# Patient Record
Sex: Female | Born: 1975 | Hispanic: Yes | Smoking: Never smoker
Health system: Southern US, Community
[De-identification: ages and names within clinical notes are randomized; demographics above are authoritative.]

## PROBLEM LIST (undated history)

## (undated) DIAGNOSIS — D649 Anemia, unspecified: Secondary | ICD-10-CM

## (undated) DIAGNOSIS — O009 Unspecified ectopic pregnancy without intrauterine pregnancy: Secondary | ICD-10-CM

## (undated) HISTORY — PX: BREAST BIOPSY: SHX20

---

## 2013-03-08 HISTORY — PX: LAPAROSCOPIC UNILATERAL SALPINGECTOMY: SHX5934

## 2014-10-08 ENCOUNTER — Inpatient Hospital Stay (HOSPITAL_COMMUNITY)
Admission: AD | Admit: 2014-10-08 | Discharge: 2014-10-08 | Disposition: A | Discharge status: Home / Self Care | Payer: Self-pay | Source: Ambulatory Visit | Attending: Family Medicine | Admitting: Family Medicine

## 2014-10-08 ENCOUNTER — Encounter (HOSPITAL_COMMUNITY): Payer: Self-pay | Admitting: *Deleted

## 2014-10-08 DIAGNOSIS — N912 Amenorrhea, unspecified: Secondary | ICD-10-CM

## 2014-10-08 DIAGNOSIS — R109 Unspecified abdominal pain: Secondary | ICD-10-CM

## 2014-10-08 LAB — CBC WITH DIFFERENTIAL/PLATELET
BASOS PCT: 1 % (ref 0–1)
Basophils Absolute: 0.1 10*3/uL (ref 0.0–0.1)
EOS ABS: 0.2 10*3/uL (ref 0.0–0.7)
Eosinophils Relative: 3 % (ref 0–5)
HCT: 40.7 % (ref 36.0–46.0)
Hemoglobin: 13.7 g/dL (ref 12.0–15.0)
Lymphocytes Relative: 33 % (ref 12–46)
Lymphs Abs: 2.2 10*3/uL (ref 0.7–4.0)
MCH: 31.9 pg (ref 26.0–34.0)
MCHC: 33.7 g/dL (ref 30.0–36.0)
MCV: 94.9 fL (ref 78.0–100.0)
MONOS PCT: 9 % (ref 3–12)
Monocytes Absolute: 0.6 10*3/uL (ref 0.1–1.0)
NEUTROS ABS: 3.8 10*3/uL (ref 1.7–7.7)
NEUTROS PCT: 54 % (ref 43–77)
PLATELETS: 306 10*3/uL (ref 150–400)
RBC: 4.29 MIL/uL (ref 3.87–5.11)
RDW: 14.9 % (ref 11.5–15.5)
WBC: 6.9 10*3/uL (ref 4.0–10.5)

## 2014-10-08 LAB — HCG, QUANTITATIVE, PREGNANCY

## 2014-10-08 LAB — WET PREP, GENITAL
Clue Cells Wet Prep HPF POC: NONE SEEN
TRICH WET PREP: NONE SEEN
Yeast Wet Prep HPF POC: NONE SEEN

## 2014-10-08 LAB — POCT PREGNANCY, URINE: Preg Test, Ur: NEGATIVE

## 2014-10-08 NOTE — MAU Note (Signed)
Pain in lower abd, started yesterday. Comes and goes.  No bleeding or discharge

## 2014-10-08 NOTE — Discharge Instructions (Signed)
All your blood and urine today are normal. Your pregnancy test is negative. Follow up with the health department for a pap smear and birth control. Take tylenol as needed for pain.   Dolor abdominal en las mujeres (Abdominal Pain, Women) El dolor abdominal (en el estmago, la pelvis o el vientre) puede tener muchas causas. Es importante que le informe a su mdico:  La ubicacin del Social research officer, government.  Viene y se va, o persiste todo el tiempo?  Hay situaciones que English as a second language teacher (comer ciertos alimentos, la actividad fsica)?  Tiene otros sntomas asociados al dolor (fiebre, nuseas, vmitos, diarrea)? Todo es de gran ayuda cuando se trata de hallar la causa del dolor. CAUSAS  Estmago: Infecciones por virus o bacterias, o lcera.  Intestino: Apendicitis (apndice inflamado), ileitis regional (enfermedad de Crohn), colitis ulcerosa (colon inflamado), sndrome del colon irritable, diverticulitis (inflamacin de los divertculos del colon) o cncer de estmago oo intestino.  Enfermedades de la vescula biliar o clculos.  Enfermedades renales, clculos o infecciones en el rin.  Infeccin o cncer del pncreas.  Fibromialgia (trastorno doloroso)  Enfermedades de los rganos femeninos:  Uterus: tero: fibroma (tumor no canceroso) o infeccin  Trompas de [place]: infeccin o embarazo ectpico  En los ovarios, quistes o tumores.  Adherencias plvicas (tejido cicatrizal).  Endometriosis (el tejido que cubre el tero se desarrolla en la pelvis y los rganos plvicos).  Sndrome de Occupational psychologist (los rganos femeninos se llenan de sangre antes del periodo menstrual(  Dolor durante el periodo menstrual.  Dolor durante la ovulacin (al producir vulos).  Dolor al usar el DIU (dispositivo intrauterino para el control de la natalidad)  Programmer, systems los rganos femeninos.  Dolor funcional (no est originado en una enfermedad, puede mejorar sin tratamiento).  Dolor de origen  psicolgico  Depresin. DIAGNSTICO Su mdico decidir la gravedad del dolor a travs del examen fsico  Anlisis de sangre  Radiografas  Ecografas  TC (tomografa computada, tipo especial de radiografas).  IMR (resonancia magntica)  Cultivos, en el caso una infeccin  Colon por enema de bario (se inserta una sustancia de contraste en el intestino grueso para mejorar la observacin con rayos X.)  Colonoscopa (observacin del intestino con un tubo luminoso).  Laparoscopa (examen del interior del abdomen con un tubo que tiene [employer]).  Ciruga exploratoria abdominal mayor (se observa el abdomen realizando una gran incisin). TRATAMIENTO El tratamiento depender de la causa del problema.   Muchos de estos casos pueden controlarse y tratarse en casa.  Medicamentos de venta libre indicados por el mdico.  Medicamentos con receta.  Antibiticos, en caso de infeccin  Pldoras anticonceptivas, en el caso de perodos dolorosos o dolor al ovular.  Tratamiento hormonal, para la endometriosis  Inyecciones para bloqueo nervioso selectivo.  Fisioterapia.  Antidepresivos.  Consejos por parte de un psclogo o psiquiatra.  Ciruga mayor o menor. INSTRUCCIONES PARA EL CUIDADO DOMICILIARIO  No tome ni administre laxantes a menos que se lo haya indicado su mdico.  Tome analgsicos de venta libre slo si se lo ha indicado el profesional que lo asiste. No tome aspirina, ya que puede causar [employer] o hemorragias.  Consuma una dieta lquida (caldo o agua) segn lo indicado por el mdico. Progrese lentamente a una dieta blanda, segn la tolerancia, si el dolor se relaciona con el estmago o el intestino.  Tenga un termmetro y tmese la temperatura varias veces al da.  Haga reposo en la cama y [place], si esto Ship broker.  Evite las relaciones sexuales, Higher education careers adviser.  Evite las situaciones estresantes.  Cumpla con las visitas y los  anlisis de control, segn las indicaciones de su mdico.  Si el dolor no se [place] con los medicamentos o la [place], [place] tratar con:  Acupuntura.  Ejercicios de relajacin (yoga, meditacin).  Terapia grupal.  Psicoterapia. SOLICITE ATENCIN MDICA SI:  Nota que ciertos Writer de [place].  El tratamiento indicado para Lexicographer no Engineer, civil (consulting).  Necesita analgsicos ms fuertes.  Quiere que le retiren el DIU.  Si se siente confundido o desfalleciente.  Presenta nuseas o vmitos.  Aparece una erupcin cutnea.  Sufre efectos adversos o una reaccin alrgica debido a los medicamentos que toma. SOLICITE ATENCIN MDICA DE INMEDIATO SI:  El dolor persiste o se agrava.  Tiene fiebre.  Siente el dolor slo en algunos sectores del abdomen. Si se localiza en la zona derecha, posiblemente podra tratarse de apendicitis. En un adulto, si se localiza en la regin inferior izquierda del abdomen, podra tratarse de colitis o diverticulitis.  Hay sangre en las heces (deposiciones de color rojo brillante o negro alquitranado), con o sin vmitos.  Usted presenta sangre en la orina.  Siente escalofros con o sin fiebre.  Se desmaya. ASEGRESE QUE:   Comprende estas instrucciones.  Controlar su enfermedad.  Solicitar ayuda de inmediato si no mejora o si empeora. Document Released: 12/02/2008 Document Revised: 11/08/2011 [facility] Patient Information 2015 [place]. This information is not intended to replace advice given to you by your health care provider. Make sure you discuss any questions you have with your health care provider.

## 2014-10-08 NOTE — MAU Note (Signed)
Interpreter paged several times at [phone] with no response. Now overhead paged

## 2014-10-08 NOTE — MAU Provider Note (Signed)
CSN: [id number]     Arrival date & time 10/08/14  1133 History   None    Chief Complaint  Patient presents with  . Abdominal Pain     (Consider location/radiation/quality/duration/timing/severity/associated sxs/prior Treatment) Patient is a 39 y.o. female presenting with abdominal pain. The history is provided by the patient.  Abdominal Pain The primary symptoms of the illness include abdominal pain. The current episode started yesterday. The onset of the illness was gradual.  Additional symptoms associated with the illness include frequency. Symptoms associated with the illness do not include chills, anorexia, constipation, hematuria or back pain.   Maureen Livingston is a 39 y.o. [id number], Patient's last menstrual period was 08/19/2014. She presents to the ED for a pregnancy test and complaint of abdominal pain. She describes the pain as a squeezing pain in her lower abdomen. She had one episode yesterday that went away and then the same pain returned today. Hx of one EAB, two SAB and one ectopic pregnancy that required surgery. Last pap smear less than one year ago in [place] and was normal. No hx of STI, Sexually active with one partner x 1 year. No birth control.   Past Medical History  Diagnosis Date  . Medical history non-contributory    Past Surgical History  Procedure Laterality Date  . Breast biopsy    . Laparoscopic unilateral salpingectomy  03/08/13    Right side due to ectopic   History reviewed. No pertinent family history. History  Substance Use Topics  . Smoking status: Never Smoker   . Smokeless tobacco: Not on file  . Alcohol Use: Yes     Comment: occasional    OB History    Gravida Para Term Preterm AB TAB SAB Ectopic Multiple Living   4    4  2 1   0     Review of Systems  Constitutional: Negative for chills.  Gastrointestinal: Positive for abdominal pain. Negative for constipation and anorexia.  Genitourinary: Positive for frequency. Negative for hematuria.   Musculoskeletal: Negative for back pain.  all other systems negative    Allergies  Aspirin  Home Medications   Prior to Admission medications   Not on File   BP 92/62 mmHg  Pulse 62  Temp(Src) 97.2 F (36.2 C) (Oral)  Resp 18  Ht 5\' 2"  (1.575 m)  Wt 214 lb (97.07 kg)  BMI 39.13 kg/m2  SpO2 100%  LMP 08/19/2014 Physical Exam  Constitutional: She is oriented to person, place, and time. She appears well-developed and well-nourished.  HENT:  Head: Normocephalic.  Eyes: Conjunctivae and EOM are normal.  Neck: Neck supple.  Cardiovascular: Normal rate.   Pulmonary/Chest: Effort normal.  Abdominal: Soft.  Unable to reproduce the cramping pain the patient has had off and on.   Genitourinary:  External genitalia without lesions. White d/c vaginal vault, no CMT, no adnexal tenderness, uterus without palpable enlargement.   Musculoskeletal: Normal range of motion.  Neurological: She is alert and oriented to person, place, and time. No cranial nerve deficit.  Skin: Skin is warm and dry.  Psychiatric: She has a normal mood and affect. Her behavior is normal.  Nursing note and vitals reviewed.   ED Course  Procedures (including critical care time)   MDM  39 y.o. female with amenorrhea and abdominal cramping x 2 days. Stable for d/c with normal CBC, Bhcg <1 and soft, non surgical abdomen. She will take Advil as needed for pain. She will follow up with the [facility] or the  Health Department. Discussed causes of amenorrhea and plan of care. Patient voices understanding and agrees with plan.  Final diagnoses:  Amenorrhea  Abdominal cramping

## 2014-10-08 NOTE — Progress Notes (Signed)
Written and verbal d/c instructions given and understanding voiced. In-house spanish interpreter helped with d/c instructions

## 2014-10-09 LAB — HIV ANTIBODY (ROUTINE TESTING W REFLEX): HIV SCREEN 4TH GENERATION: NONREACTIVE

## 2014-10-09 LAB — GC/CHLAMYDIA PROBE AMP (~~LOC~~) NOT AT ARMC
Chlamydia: NEGATIVE
Neisseria Gonorrhea: NEGATIVE

## 2014-10-09 LAB — RPR: RPR Ser Ql: NONREACTIVE

## 2014-11-12 ENCOUNTER — Emergency Department (HOSPITAL_COMMUNITY): Payer: Self-pay

## 2014-11-12 ENCOUNTER — Emergency Department (HOSPITAL_COMMUNITY)
Admission: EM | Admit: 2014-11-12 | Discharge: 2014-11-12 | Disposition: A | Discharge status: Home / Self Care | Payer: Self-pay | Attending: Emergency Medicine | Admitting: Emergency Medicine

## 2014-11-12 ENCOUNTER — Encounter (HOSPITAL_COMMUNITY): Payer: Self-pay

## 2014-11-12 DIAGNOSIS — D259 Leiomyoma of uterus, unspecified: Secondary | ICD-10-CM | POA: Insufficient documentation

## 2014-11-12 DIAGNOSIS — R1031 Right lower quadrant pain: Secondary | ICD-10-CM

## 2014-11-12 DIAGNOSIS — Z3202 Encounter for pregnancy test, result negative: Secondary | ICD-10-CM | POA: Insufficient documentation

## 2014-11-12 LAB — URINALYSIS, ROUTINE W REFLEX MICROSCOPIC
Bilirubin Urine: NEGATIVE
GLUCOSE, UA: NEGATIVE mg/dL
Ketones, ur: NEGATIVE mg/dL
Nitrite: NEGATIVE
PROTEIN: NEGATIVE mg/dL
SPECIFIC GRAVITY, URINE: 1.02 (ref 1.005–1.030)
Urobilinogen, UA: 0.2 mg/dL (ref 0.0–1.0)
pH: 6.5 (ref 5.0–8.0)

## 2014-11-12 LAB — COMPREHENSIVE METABOLIC PANEL
ALBUMIN: 3.9 g/dL (ref 3.5–5.2)
ALT: 19 U/L (ref 0–35)
AST: 19 U/L (ref 0–37)
Alkaline Phosphatase: 84 U/L (ref 39–117)
Anion gap: 6 (ref 5–15)
BILIRUBIN TOTAL: 0.4 mg/dL (ref 0.3–1.2)
BUN: 14 mg/dL (ref 6–23)
CO2: 26 mmol/L (ref 19–32)
CREATININE: 0.7 mg/dL (ref 0.50–1.10)
Calcium: 9.2 mg/dL (ref 8.4–10.5)
Chloride: 107 mmol/L (ref 96–112)
GFR calc Af Amer: >90 mL/min (ref >90)
GFR calc non Af Amer: >90 mL/min (ref >90)
Glucose, Bld: 94 mg/dL (ref 70–99)
Potassium: 4 mmol/L (ref 3.5–5.1)
Sodium: 139 mmol/L (ref 135–145)
TOTAL PROTEIN: 7.7 g/dL (ref 6.0–8.3)

## 2014-11-12 LAB — CBC WITH DIFFERENTIAL/PLATELET
Basophils Absolute: 0 10*3/uL (ref 0.0–0.1)
Basophils Relative: 0 % (ref 0–1)
EOS PCT: 2 % (ref 0–5)
Eosinophils Absolute: 0.1 10*3/uL (ref 0.0–0.7)
HEMATOCRIT: 39.9 % (ref 36.0–46.0)
Hemoglobin: 13.3 g/dL (ref 12.0–15.0)
Lymphocytes Relative: 28 % (ref 12–46)
Lymphs Abs: 2 10*3/uL (ref 0.7–4.0)
MCH: 31.3 pg (ref 26.0–34.0)
MCHC: 33.3 g/dL (ref 30.0–36.0)
MCV: 93.9 fL (ref 78.0–100.0)
MONO ABS: 0.6 10*3/uL (ref 0.1–1.0)
MONOS PCT: 8 % (ref 3–12)
NEUTROS ABS: 4.6 10*3/uL (ref 1.7–7.7)
Neutrophils Relative %: 62 % (ref 43–77)
Platelets: 338 10*3/uL (ref 150–400)
RBC: 4.25 MIL/uL (ref 3.87–5.11)
RDW: 14.3 % (ref 11.5–15.5)
WBC: 7.4 10*3/uL (ref 4.0–10.5)

## 2014-11-12 LAB — URINE MICROSCOPIC-ADD ON

## 2014-11-12 LAB — LIPASE, BLOOD: LIPASE: 54 U/L (ref 11–59)

## 2014-11-12 LAB — POC URINE PREG, ED: PREG TEST UR: NEGATIVE

## 2014-11-12 MED ORDER — ONDANSETRON HCL 4 MG/2ML IJ SOLN
4.0000 mg | Freq: Once | INTRAMUSCULAR | Status: AC
  Administered 2014-11-12: 4 mg via INTRAVENOUS
  Filled 2014-11-12: qty 2

## 2014-11-12 MED ORDER — MORPHINE SULFATE 4 MG/ML IJ SOLN
4.0000 mg | Freq: Once | INTRAMUSCULAR | Status: AC
  Administered 2014-11-12: 4 mg via INTRAVENOUS
  Filled 2014-11-12: qty 1

## 2014-11-12 MED ORDER — HYDROCODONE-ACETAMINOPHEN 5-325 MG PO TABS: 1.0000 | ORAL_TABLET | Freq: Four times a day (QID) | ORAL | Status: DC | PRN

## 2014-11-12 MED ORDER — IOHEXOL 300 MG/ML  SOLN
100.0000 mL | Freq: Once | INTRAMUSCULAR | Status: AC | PRN
  Administered 2014-11-12: 100 mL via INTRAVENOUS

## 2014-11-12 MED ORDER — IOHEXOL 300 MG/ML  SOLN
50.0000 mL | Freq: Once | INTRAMUSCULAR | Status: AC | PRN
  Administered 2014-11-12: 50 mL via ORAL

## 2014-11-12 NOTE — Progress Notes (Signed)
  CARE MANAGEMENT ED NOTE 11/12/2014  Patient:  Maureen Livingston, Maureen Livingston   Account Number:  [id number]  Date Initiated:  11/12/2014  Documentation initiated by:  Livia Snellen  Subjective/Objective Assessment:   patient presents to ED with lower abdominal pain     Subjective/Objective Assessment Detail:     Action/Plan:   Action/Plan Detail:   Anticipated DC Date:  11/12/2014     Status Recommendation to Physician:   Result of Recommendation:    Other ED Services  Consult [facility] [place]  Other  PCP issues  Outpatient Services - Pt will follow up    Choice offered to / List presented to:            Status of service:  Completed, signed off  ED Comments:   ED Comments Detail:  EDCM spoke to patient at bedside.  Patient confirms she does not have insurance or a pcp living in [place]. [facility] provided patient with pamphlet to [facility], explained services offered and walk in times.  Patient thankful for resources.  No further EDCM needs at this time.

## 2014-11-12 NOTE — ED Notes (Signed)
Pt c/o lower abdominal pain starting this morning.  Pain score 7/10.  Denies n/v/d.  Pt reports taking Tylenol w/o relief.

## 2014-11-12 NOTE — Discharge Instructions (Signed)
Fibroma uterino (Uterine Fibroid) Un fibroma uterino es un crecimiento (tumor) dentro del tero. Este tipo de tumor no es Radio broadcast assistant y no se extiende fuera del tero. Podr tener uno o varios fibromas. Los fibromas pueden variar en tamao, peso y TEFL teacher en que se desarrollan dentro del tero. Algunos pueden llegar a ser bastante grandes. La mayora de los fibromas no necesitan tratamiento mdico, pero algunos pueden causar dolor o sangrado abundante durante los perodos y [place]. CAUSAS  Un fibroma es el resultado del desarrollo continuo de una nica clula uterina que sigue creciendo (no regulada) que es diferente al resto de las clulas del cuerpo humano. La mayora de las clulas tiene un mecanismo de control que evita que se reproduzcan de Research officer, trade union.  SIGNOS Y SNTOMAS   Hemorragias.  Dolor y sensacin de presin en la pelvis.  Problemas en la vejiga debido al tamao del fibroma.  Infertilidad y abortos espontneos, segn el tamao y la ubicacin del fibroma. DIAGNSTICO  Los fibromas uterinos se diagnostican con un examen fsico. El mdico puede palpar los tumores abultados al realizar el examen de la pelvis. Una ecografa puede indicarse para tener informacin del tamao, la ubicacin y el nmero de tumores.  TRATAMIENTO   El mdico puede considerar que es conveniente esperar y Barrister's clerk. Esto incluye el control del fibroma por parte del mdico para observar si crece o disminuye su tamao.  Podr indicarle un tratamiento hormonal o el uso de un dispositivo intrauterino (DIU).  En algunos casos es necesaria la ciruga para extirpar el fibroma (miomectoma) o el tero (histerectoma). Esto depender de su situacin. Cuando una mujer desea quedar embarazada y los fibromas interfieren en su fertilidad, el mdico puede recomendar la extirpacin del fibroma.  INSTRUCCIONES PARA EL CUIDADO EN EL HOGAR  Los cuidados en el hogar dependen del tratamiento que haya  recibido. En general:   Cumpla con todas las visitas de control, segn le indique su mdico.  Tome slo medicamentos de venta libre o recetados, segn las indicaciones del mdico. Si le recetaron un tratamiento hormonal, tome los medicamentos hormonales como le indicaron. No tome aspirina. Puede ocasionar hemorragias.  Consulte al mdico si debe tomar pldoras de hierro.  Si sus perodos son molestos pero no tan abundantes, acustese con los pies ligeramente elevados por encima del nivel del corazn. Coloque compresas fras en la zona inferior del abdomen.  Si sus perodos son muy abundantes, anote el nmero de compresas o tampones que [place] cada mes. Lleve esta informacin a su consulta mdica.  Incluya vegetales verdes en su dieta. SOLICITE ATENCIN MDICA DE INMEDIATO SI:  Siente dolor o clicos en la pelvis y no puede controlarlos con los medicamentos.  El dolor en la pelvis aumenta de manera repentina.  Aumenta el sangrado entre los perodos o [employer].  Si tiene perodos muy abundantes y debe cambiar un tampn o una toalla higinica cada media hora o menos.  Se siente mareado o tiene episodios de [place]. Document Released: 08/16/2005 Document Revised: 06/06/2013 [facility] Patient Information 2015 [place], [place]. This information is not intended to replace advice given to you by your health care provider. Make sure you discuss any questions you have with your health care provider.

## 2014-11-12 NOTE — ED Notes (Signed)
Pt reports low abd pain today, denies n/v/d.

## 2014-11-12 NOTE — ED Notes (Signed)
Patient transported to CT 

## 2014-11-12 NOTE — ED Provider Notes (Signed)
CSN: [id number]     Arrival date & time 11/12/14  1556 History   First MD Initiated Contact with Patient 11/12/14 1759     Chief Complaint  Patient presents with  . Abdominal Pain     (Consider location/radiation/quality/duration/timing/severity/associated sxs/prior Treatment) HPI Comments: Patient presents emergency department with a chief complaint of right lower quadrant abdominal pain. She states pain started this morning. She rates her pain as a 7 out of 10. She states that it is constant. It is worsened with palpation. She denies any nausea, vomiting, diarrhea. Denies any vaginal discharge bleeding. Last menstrual period was in December. His that she has a history of ectopic pregnancy with laparoscopic salpingectomy.  She has tried taking some Tylenol with no relief. She denies any associated fevers or chills.  The history is provided by the patient. No language interpreter was used.    Past Medical History  Diagnosis Date  . Medical history non-contributory    Past Surgical History  Procedure Laterality Date  . Breast biopsy    . Laparoscopic unilateral salpingectomy  03/08/13    Right side due to ectopic   History reviewed. No pertinent family history. History  Substance Use Topics  . Smoking status: Never Smoker   . Smokeless tobacco: Not on file  . Alcohol Use: Yes     Comment: occasional    OB History    Gravida Para Term Preterm AB TAB SAB Ectopic Multiple Living   4    4  2 1   0     Review of Systems  Constitutional: Negative for fever and chills.  Respiratory: Negative for shortness of breath.   Cardiovascular: Negative for chest pain.  Gastrointestinal: Positive for abdominal pain. Negative for nausea, vomiting, diarrhea and constipation.  Genitourinary: Negative for dysuria.  All other systems reviewed and are negative.     Allergies  Aspirin  Home Medications   Prior to Admission medications   Medication Sig Start Date End Date Taking?  Authorizing Provider  acetaminophen (TYLENOL) 500 MG tablet Take 1,000 mg by mouth every 6 (six) hours as needed for moderate pain or headache.   Yes Historical Provider, MD   BP 117/69 mmHg  Pulse 82  Temp(Src) 98.2 F (36.8 C) (Oral)  Resp 18  SpO2 100%  LMP 08/19/2014 Physical Exam  Constitutional: She is oriented to person, place, and time. She appears well-developed and well-nourished.  HENT:  Head: Normocephalic and atraumatic.  Eyes: Conjunctivae and EOM are normal. Pupils are equal, round, and reactive to light.  Neck: Normal range of motion. Neck supple.  Cardiovascular: Normal rate and regular rhythm.  Exam reveals no gallop and no friction rub.   No murmur heard. Pulmonary/Chest: Effort normal and breath sounds normal. No respiratory distress. She has no wheezes. She has no rales. She exhibits no tenderness.  Abdominal: Soft. Bowel sounds are normal. She exhibits no distension and no mass. There is tenderness. There is no rebound and no guarding.  Right lower quadrant and suprapubic region tenderness palpation, no other focal abdominal tenderness  Musculoskeletal: Normal range of motion. She exhibits no edema or tenderness.  Neurological: She is alert and oriented to person, place, and time.  Skin: Skin is warm and dry.  Psychiatric: She has a normal mood and affect. Her behavior is normal. Judgment and thought content normal.  Nursing note and vitals reviewed.   ED Course  Procedures (including critical care time) Results for orders placed or performed during the hospital encounter of 11/12/14  CBC with Differential  Result Value Ref Range   WBC 7.4 4.0 - 10.5 K/uL   RBC 4.25 3.87 - 5.11 MIL/uL   Hemoglobin 13.3 12.0 - 15.0 g/dL   HCT 39.9 36.0 - 46.0 %   MCV 93.9 78.0 - 100.0 fL   MCH 31.3 26.0 - 34.0 pg   MCHC 33.3 30.0 - 36.0 g/dL   RDW 14.3 11.5 - 15.5 %   Platelets 338 150 - 400 K/uL   Neutrophils Relative % 62 43 - 77 %   Neutro Abs 4.6 1.7 - 7.7 K/uL    Lymphocytes Relative 28 12 - 46 %   Lymphs Abs 2.0 0.7 - 4.0 K/uL   Monocytes Relative 8 3 - 12 %   Monocytes Absolute 0.6 0.1 - 1.0 K/uL   Eosinophils Relative 2 0 - 5 %   Eosinophils Absolute 0.1 0.0 - 0.7 K/uL   Basophils Relative 0 0 - 1 %   Basophils Absolute 0.0 0.0 - 0.1 K/uL  Comprehensive metabolic panel  Result Value Ref Range   Sodium 139 135 - 145 mmol/L   Potassium 4.0 3.5 - 5.1 mmol/L   Chloride 107 96 - 112 mmol/L   CO2 26 19 - 32 mmol/L   Glucose, Bld 94 70 - 99 mg/dL   BUN 14 6 - 23 mg/dL   Creatinine, Ser 0.70 0.50 - 1.10 mg/dL   Calcium 9.2 8.4 - 10.5 mg/dL   Total Protein 7.7 6.0 - 8.3 g/dL   Albumin 3.9 3.5 - 5.2 g/dL   AST 19 0 - 37 U/L   ALT 19 0 - 35 U/L   Alkaline Phosphatase 84 39 - 117 U/L   Total Bilirubin 0.4 0.3 - 1.2 mg/dL   GFR calc non Af Amer >90 >90 mL/min   GFR calc Af Amer >90 >90 mL/min   Anion gap 6 5 - 15  Lipase, blood  Result Value Ref Range   Lipase 54 11 - 59 U/L  Urinalysis, Routine w reflex microscopic  Result Value Ref Range   Color, Urine YELLOW YELLOW   APPearance CLOUDY (A) CLEAR   Specific Gravity, Urine 1.020 1.005 - 1.030   pH 6.5 5.0 - 8.0   Glucose, UA NEGATIVE NEGATIVE mg/dL   Hgb urine dipstick TRACE (A) NEGATIVE   Bilirubin Urine NEGATIVE NEGATIVE   Ketones, ur NEGATIVE NEGATIVE mg/dL   Protein, ur NEGATIVE NEGATIVE mg/dL   Urobilinogen, UA 0.2 0.0 - 1.0 mg/dL   Nitrite NEGATIVE NEGATIVE   Leukocytes, UA SMALL (A) NEGATIVE  Urine microscopic-add on  Result Value Ref Range   Squamous Epithelial / LPF FEW (A) RARE   WBC, UA 0-2 <3 WBC/hpf   Bacteria, UA RARE RARE  POC Urine Pregnancy, ED  (If Pre-menopausal female) - do not order at [facility]  Result Value Ref Range   Preg Test, Ur NEGATIVE NEGATIVE   Ct Abdomen Pelvis W Contrast  11/12/2014   CLINICAL DATA:  Right lower quadrant pain beginning this morning. No nausea vomiting or diarrhea.  EXAM: CT ABDOMEN AND PELVIS WITH CONTRAST  TECHNIQUE: Multidetector CT  imaging of the abdomen and pelvis was performed using the standard protocol following bolus administration of intravenous contrast.  CONTRAST:  131mL OMNIPAQUE IOHEXOL 300 MG/ML SOLN, 56mL OMNIPAQUE IOHEXOL 300 MG/ML SOLN  COMPARISON:  None.  FINDINGS: Lung bases are within normal.  Abdominal images demonstrate a normal liver, spleen, pancreas, gallbladder and adrenal glands. Kidneys are normal. Vascular structures are within normal. The appendix  is normal. There is no free fluid or inflammatory change. Colon is within normal.  There are a few mildly prominent air and contrast filled small bowel loops in the left upper quadrant.  Images through the pelvis demonstrate possible small fibroid over the left side of the uterine body. The bladder, ovaries and rectum are within normal. Remaining bones and soft tissues are within normal.  IMPRESSION: No acute findings in the abdomen/pelvis.  Possible small uterine fibroid.   Electronically Signed   By: Marin Olp M.D.   On: 11/12/2014 19:44      EKG Interpretation None      MDM   Final diagnoses:  RLQ abdominal pain  Uterine leiomyoma, unspecified location    Patient with right lower quadrant pain and suprapubic pain. Denies any dysuria. Denies any fevers, chills, nausea, or vomiting. Will treat pain, will check labs, and anticipate CT scan. Negative pregnancy test. No vaginal symptoms.  Labs are reassuring. Pregnancy test negative. Urinalysis is unremarkable. CT scan remarkable for uterine fibroids, no other acute process. Discharged home with some pain medicine and recommend OB/GYN follow-up. Patient understands and agrees with the plan. She is stable and ready for discharge.   Montine Circle, PA-C 11/12/14 2102  Dorie Rank, MD 11/12/14 2107

## 2014-12-23 ENCOUNTER — Ambulatory Visit (INDEPENDENT_AMBULATORY_CARE_PROVIDER_SITE_OTHER): Payer: Self-pay | Admitting: Obstetrics & Gynecology

## 2014-12-23 ENCOUNTER — Encounter: Payer: Self-pay | Admitting: Obstetrics & Gynecology

## 2014-12-23 VITALS — BP 106/78 | HR 95 | Temp 98.7°F | Wt 229.2 lb

## 2014-12-23 DIAGNOSIS — Z01419 Encounter for gynecological examination (general) (routine) without abnormal findings: Secondary | ICD-10-CM

## 2014-12-23 DIAGNOSIS — Z118 Encounter for screening for other infectious and parasitic diseases: Secondary | ICD-10-CM

## 2014-12-23 DIAGNOSIS — Z124 Encounter for screening for malignant neoplasm of cervix: Secondary | ICD-10-CM

## 2014-12-23 DIAGNOSIS — Z0000 Encounter for general adult medical examination without abnormal findings: Secondary | ICD-10-CM

## 2014-12-23 DIAGNOSIS — N938 Other specified abnormal uterine and vaginal bleeding: Secondary | ICD-10-CM

## 2014-12-23 DIAGNOSIS — Z113 Encounter for screening for infections with a predominantly sexual mode of transmission: Secondary | ICD-10-CM

## 2014-12-23 DIAGNOSIS — Z1151 Encounter for screening for human papillomavirus (HPV): Secondary | ICD-10-CM

## 2014-12-23 DIAGNOSIS — Z3202 Encounter for pregnancy test, result negative: Secondary | ICD-10-CM

## 2014-12-23 LAB — TSH: TSH: 3.652 u[IU]/mL (ref 0.350–4.500)

## 2014-12-23 LAB — POCT PREGNANCY, URINE: PREG TEST UR: NEGATIVE

## 2014-12-23 MED ORDER — MISOPROSTOL 200 MCG PO TABS: ORAL_TABLET | ORAL | Status: DC

## 2014-12-23 NOTE — Progress Notes (Signed)
Patient ID: Maureen Livingston, female   DOB: 1975-10-10, 39 y.o.   MRN: [id number] Pt complains of heavy vaginal bleeding with clots during periods.  Periods have been irregular since December.    [place]

## 2014-12-23 NOTE — Progress Notes (Signed)
   Subjective:    Patient ID: Maureen Livingston, female    DOB: Oct 20, 1975, 39 y.o.   MRN: [id number]  HPI 39 yo S G0 from [place] is here today with the complaints of heavy periods that last 4-5 days per month. She reports her periods to always be monthly until she missed 3 periods in a row this spring. She then took some herbs from the DR and got her period this month. She reports her periods to be very painful and heavy. A CT done in the ER for this same issue showed a possible fibroid, no measurements.   Review of Systems She uses condoms for contracetion. She reports some occasional GSUI. She had her pap smear recently [facility] the [facility].    Objective:   Physical Exam Obese Hispanic Female NAD Breathing and ambulating normally Abd-centripital obesity Cervix-nulliparous with no lesion I could not feel her adnexa on bimanual and didn't appreciate any uterine abnormalities.       Assessment & Plan:  Menorrhagia/dysmenorrhea- check cervical cultures, gyn u/s, and TSH

## 2014-12-24 LAB — GC/CHLAMYDIA PROBE AMP
CT PROBE, AMP APTIMA: NEGATIVE
GC Probe RNA: NEGATIVE

## 2015-01-03 ENCOUNTER — Ambulatory Visit (HOSPITAL_COMMUNITY)
Admission: RE | Admit: 2015-01-03 | Discharge: 2015-01-03 | Disposition: A | Discharge status: Home / Self Care | Payer: Self-pay | Source: Ambulatory Visit | Attending: Obstetrics & Gynecology | Admitting: Obstetrics & Gynecology

## 2015-01-03 DIAGNOSIS — D251 Intramural leiomyoma of uterus: Secondary | ICD-10-CM | POA: Insufficient documentation

## 2015-01-03 DIAGNOSIS — N938 Other specified abnormal uterine and vaginal bleeding: Secondary | ICD-10-CM | POA: Insufficient documentation

## 2015-01-03 DIAGNOSIS — D252 Subserosal leiomyoma of uterus: Secondary | ICD-10-CM | POA: Insufficient documentation

## 2015-01-17 ENCOUNTER — Encounter: Payer: Self-pay | Admitting: Obstetrics & Gynecology

## 2015-01-17 ENCOUNTER — Ambulatory Visit (INDEPENDENT_AMBULATORY_CARE_PROVIDER_SITE_OTHER): Payer: Self-pay | Admitting: Obstetrics & Gynecology

## 2015-01-17 VITALS — BP 102/70 | HR 87 | Temp 98.4°F | Ht 63.0 in | Wt 226.2 lb

## 2015-01-17 DIAGNOSIS — D259 Leiomyoma of uterus, unspecified: Secondary | ICD-10-CM

## 2015-01-17 DIAGNOSIS — D219 Benign neoplasm of connective and other soft tissue, unspecified: Secondary | ICD-10-CM | POA: Insufficient documentation

## 2015-01-17 LAB — POCT PREGNANCY, URINE: Preg Test, Ur: NEGATIVE

## 2015-01-17 NOTE — Progress Notes (Signed)
Patient ID: Maureen Livingston, female   DOB: 1976/06/17, 39 y.o.   MRN: [id number]  Chief Complaint  Patient presents with  . Follow-up    DUB    HPI Maureen Livingston is a 39 y.o. female.  [id number] Patient's last menstrual period was 01/02/2015. Heavy painful menses partially relieved with Midol complete but needs Vicodin when sx worsen. H/O infertility and fibroids  HPI  Past Medical History  Diagnosis Date  . Medical history non-contributory     Past Surgical History  Procedure Laterality Date  . Breast biopsy    . Laparoscopic unilateral salpingectomy  03/08/13    Right side due to ectopic    History reviewed. No pertinent family history.  Social History History  Substance Use Topics  . Smoking status: Never Smoker   . Smokeless tobacco: Never Used  . Alcohol Use: Yes     Comment: occasional     Allergies  Allergen Reactions  . Aspirin Rash    Itching and rash     Current Outpatient Prescriptions  Medication Sig Dispense Refill  . acetaminophen (TYLENOL) 500 MG tablet Take 1,000 mg by mouth every 6 (six) hours as needed for moderate pain or headache.    Marland Kitchen HYDROcodone-acetaminophen (NORCO/VICODIN) 5-325 MG per tablet Take 1-2 tablets by mouth every 6 (six) hours as needed. 10 tablet 0  . omeprazole (PRILOSEC) 20 MG capsule Take 20 mg by mouth daily.    . misoprostol (CYTOTEC) 200 MCG tablet Take 3 pills by mouth the night before biopsy. (Patient not taking: Reported on 01/17/2015) 3 tablet 0   No current facility-administered medications for this visit.    Review of Systems Review of Systems  Blood pressure 102/70, pulse 87, temperature 98.4 F (36.9 C), height 5\' 3"  (1.6 m), weight 226 lb 3.2 oz (102.604 kg), last menstrual period 01/02/2015.  Physical Exam Physical Exam  Constitutional: She is oriented to person, place, and time. She appears well-developed. No distress.  Neurological: She is alert and oriented to person, place, and time.   Psychiatric: She has a normal mood and affect. Her behavior is normal.    Data Reviewed  CLINICAL DATA: Dysfunctional uterine bleeding. Fibroids. Previous right salpingectomy.  EXAM: TRANSABDOMINAL AND TRANSVAGINAL ULTRASOUND OF PELVIS  TECHNIQUE: Both transabdominal and transvaginal ultrasound examinations of the pelvis were performed. Transabdominal technique was performed for global imaging of the pelvis including uterus, ovaries, adnexal regions, and pelvic cul-de-sac. It was necessary to proceed with endovaginal exam following the transabdominal exam to visualize the endometrium and ovaries.  COMPARISON: CT on 11/12/2014  FINDINGS: Uterus  Measurements: 8.3 x 4.3 x 4.6 cm. A subserosal fibroid is seen arising from the right posterior lower uterine segment which measures 3.4 x 2.1 x 3.1 cm. A smaller intramural fibroid is seen in the right lateral corpus which measures 1.6 cm, and a third tiny fibroid is seen in the left posterior corpus measuring 0.9 cm in maximum diameter.  Endometrium  Thickness: 8 mm. No focal abnormality visualized.  Right ovary  Measurements: 3.0 x 1.2 x 1.4 cm. Normal appearance/no adnexal mass.  Left ovary  Measurements: 3.4 x 2.0 x 1.7 cm. Normal appearance/no adnexal mass.  Other findings  No free fluid.  IMPRESSION: Several uterine fibroids, largest measuring 3.4 cm.  Endometrial thickness measures 8 mm. If bleeding remains unresponsive to hormonal or medical therapy, sonohysterogram should be considered for focal lesion work-up. (Ref: Radiological Reasoning: Algorithmic Workup of Abnormal Vaginal Bleeding with Endovaginal Sonography and Sonohysterography. AJR 2008; 147:W29-56)  Normal appearance of both ovaries. No adnexal mass identified.   Electronically Signed  By: Earle Gell M.D.  On: 01/03/2015 16:10        Assessment    F/u for fibroid uterus. Discussed pain management and offered  OCP due to ASA sensitivity, declines as she wants to conceive.     Plan    Information for infertility services. Continue to use Midol  20 min face to face with patient with interpreter        Alyona Romack 01/17/2015, 12:09 PM

## 2015-01-17 NOTE — Progress Notes (Signed)
Used interpreter [employer]. Patient states did not take cytotec because she did not know about it.

## 2015-01-17 NOTE — Patient Instructions (Signed)
Fibroma uterino (Uterine Fibroid) Un fibroma uterino es un crecimiento (tumor) dentro del tero. Este tipo de tumor no es Radio broadcast assistant y no se extiende fuera del tero. Podr tener uno o varios fibromas. Los fibromas pueden variar en tamao, peso y TEFL teacher en que se desarrollan dentro del tero. Algunos pueden llegar a ser bastante grandes. La mayora de los fibromas no necesitan tratamiento mdico, pero algunos pueden causar dolor o sangrado abundante durante los perodos y [place]. CAUSAS  Un fibroma es el resultado del desarrollo continuo de una nica clula uterina que sigue creciendo (no regulada) que es diferente al resto de las clulas del cuerpo humano. La mayora de las clulas tiene un mecanismo de control que evita que se reproduzcan de Research officer, trade union.  SIGNOS Y SNTOMAS   Hemorragias.  Dolor y sensacin de presin en la pelvis.  Problemas en la vejiga debido al tamao del fibroma.  Infertilidad y abortos espontneos, segn el tamao y la ubicacin del fibroma. DIAGNSTICO  Los fibromas uterinos se diagnostican con un examen fsico. El mdico puede palpar los tumores abultados al realizar el examen de la pelvis. Una ecografa puede indicarse para tener informacin del tamao, la ubicacin y el nmero de tumores.  TRATAMIENTO   El mdico puede considerar que es conveniente esperar y Barrister's clerk. Esto incluye el control del fibroma por parte del mdico para observar si crece o disminuye su tamao.  Podr indicarle un tratamiento hormonal o el uso de un dispositivo intrauterino (DIU).  En algunos casos es necesaria la ciruga para extirpar el fibroma (miomectoma) o el tero (histerectoma). Esto depender de su situacin. Cuando una mujer desea quedar embarazada y los fibromas interfieren en su fertilidad, el mdico puede recomendar la extirpacin del fibroma.  INSTRUCCIONES PARA EL CUIDADO EN EL HOGAR  Los cuidados en el hogar dependen del tratamiento que haya  recibido. En general:   Cumpla con todas las visitas de control, segn le indique su mdico.  Tome slo medicamentos de venta libre o recetados, segn las indicaciones del mdico. Si le recetaron un tratamiento hormonal, tome los medicamentos hormonales como le indicaron. No tome aspirina. Puede ocasionar hemorragias.  Consulte al mdico si debe tomar pldoras de hierro.  Si sus perodos son molestos pero no tan abundantes, acustese con los pies ligeramente elevados por encima del nivel del corazn. Coloque compresas fras en la zona inferior del abdomen.  Si sus perodos son muy abundantes, anote el nmero de compresas o tampones que [place] cada mes. Lleve esta informacin a su consulta mdica.  Incluya vegetales verdes en su dieta. SOLICITE ATENCIN MDICA DE INMEDIATO SI:  Siente dolor o clicos en la pelvis y no puede controlarlos con los medicamentos.  El dolor en la pelvis aumenta de manera repentina.  Aumenta el sangrado entre los perodos o [employer].  Si tiene perodos muy abundantes y debe cambiar un tampn o una toalla higinica cada media hora o menos.  Se siente mareado o tiene episodios de [place]. Document Released: 08/16/2005 Document Revised: 06/06/2013 [facility] Patient Information 2015 [place], [place]. This information is not intended to replace advice given to you by your health care provider. Make sure you discuss any questions you have with your health care provider.

## 2015-09-29 ENCOUNTER — Inpatient Hospital Stay (HOSPITAL_COMMUNITY): Payer: Self-pay

## 2015-09-29 ENCOUNTER — Encounter (HOSPITAL_COMMUNITY): Payer: Self-pay | Admitting: *Deleted

## 2015-09-29 ENCOUNTER — Inpatient Hospital Stay (HOSPITAL_COMMUNITY)
Admission: AD | Admit: 2015-09-29 | Discharge: 2015-09-29 | Disposition: A | Discharge status: Home / Self Care | Payer: Self-pay | Source: Ambulatory Visit | Attending: Family Medicine | Admitting: Family Medicine

## 2015-09-29 DIAGNOSIS — D251 Intramural leiomyoma of uterus: Secondary | ICD-10-CM | POA: Insufficient documentation

## 2015-09-29 DIAGNOSIS — N83202 Unspecified ovarian cyst, left side: Secondary | ICD-10-CM | POA: Insufficient documentation

## 2015-09-29 DIAGNOSIS — D500 Iron deficiency anemia secondary to blood loss (chronic): Secondary | ICD-10-CM

## 2015-09-29 DIAGNOSIS — D252 Subserosal leiomyoma of uterus: Secondary | ICD-10-CM | POA: Insufficient documentation

## 2015-09-29 DIAGNOSIS — N938 Other specified abnormal uterine and vaginal bleeding: Secondary | ICD-10-CM | POA: Insufficient documentation

## 2015-09-29 HISTORY — DX: Unspecified ectopic pregnancy without intrauterine pregnancy: O00.90

## 2015-09-29 LAB — CBC WITH DIFFERENTIAL/PLATELET
BASOS ABS: 0 10*3/uL (ref 0.0–0.1)
Basophils Relative: 1 %
EOS ABS: 0.2 10*3/uL (ref 0.0–0.7)
Eosinophils Relative: 2 %
HCT: 29.9 % — ABNORMAL LOW (ref 36.0–46.0)
Hemoglobin: 9.6 g/dL — ABNORMAL LOW (ref 12.0–15.0)
Lymphocytes Relative: 39 %
Lymphs Abs: 2.8 10*3/uL (ref 0.7–4.0)
MCH: 28.7 pg (ref 26.0–34.0)
MCHC: 32.1 g/dL (ref 30.0–36.0)
MCV: 89.5 fL (ref 78.0–100.0)
Monocytes Absolute: 0.3 10*3/uL (ref 0.1–1.0)
Monocytes Relative: 5 %
NEUTROS PCT: 53 %
Neutro Abs: 3.9 10*3/uL (ref 1.7–7.7)
Platelets: 419 10*3/uL — ABNORMAL HIGH (ref 150–400)
RBC: 3.34 MIL/uL — AB (ref 3.87–5.11)
RDW: 14.9 % (ref 11.5–15.5)
WBC: 7.3 10*3/uL (ref 4.0–10.5)

## 2015-09-29 LAB — URINALYSIS, ROUTINE W REFLEX MICROSCOPIC
Bilirubin Urine: NEGATIVE
Glucose, UA: NEGATIVE mg/dL
Hgb urine dipstick: NEGATIVE
Ketones, ur: NEGATIVE mg/dL
LEUKOCYTES UA: NEGATIVE
NITRITE: NEGATIVE
PROTEIN: NEGATIVE mg/dL
Specific Gravity, Urine: 1.01 (ref 1.005–1.030)
pH: 7 (ref 5.0–8.0)

## 2015-09-29 LAB — WET PREP, GENITAL
Clue Cells Wet Prep HPF POC: NONE SEEN
Sperm: NONE SEEN
TRICH WET PREP: NONE SEEN
WBC, Wet Prep HPF POC: NONE SEEN
Yeast Wet Prep HPF POC: NONE SEEN

## 2015-09-29 LAB — POCT PREGNANCY, URINE: PREG TEST UR: NEGATIVE

## 2015-09-29 MED ORDER — FERUMOXYTOL INJECTION 510 MG/17 ML
510.0000 mg | Freq: Once | INTRAVENOUS | Status: AC
  Administered 2015-09-29: 510 mg via INTRAVENOUS
  Filled 2015-09-29: qty 17

## 2015-09-29 MED ORDER — HYDROCODONE-ACETAMINOPHEN 7.5-325 MG/15ML PO SOLN: 10.0000 mL | Freq: Once | ORAL | Status: DC

## 2015-09-29 MED ORDER — IBUPROFEN 800 MG PO TABS
400.0000 mg | ORAL_TABLET | Freq: Once | ORAL | Status: AC
  Administered 2015-09-29: 400 mg via ORAL
  Filled 2015-09-29: qty 1

## 2015-09-29 MED ORDER — OXYCODONE-ACETAMINOPHEN 5-325 MG PO TABS
2.0000 | ORAL_TABLET | Freq: Once | ORAL | Status: AC
Start: 2015-09-29 — End: 2015-09-29
  Administered 2015-09-29: 2 via ORAL
  Filled 2015-09-29: qty 2

## 2015-09-29 MED ORDER — MEGESTROL ACETATE 40 MG PO TABS
40.0000 mg | ORAL_TABLET | Freq: Once | ORAL | Status: AC
  Administered 2015-09-29: 40 mg via ORAL
  Filled 2015-09-29: qty 1

## 2015-09-29 MED ORDER — MEGESTROL ACETATE 40 MG PO TABS: 40.0000 mg | ORAL_TABLET | Freq: Three times a day (TID) | ORAL | Status: DC

## 2015-09-29 MED ORDER — SODIUM CHLORIDE 0.9 % IV SOLN
INTRAVENOUS | Status: DC
  Administered 2015-09-29: 18:00:00 via INTRAVENOUS

## 2015-09-29 MED ORDER — FERROUS SULFATE 325 (65 FE) MG PO TABS: 325.0000 mg | ORAL_TABLET | Freq: Two times a day (BID) | ORAL | Status: DC

## 2015-09-29 MED ORDER — HYDROCODONE-IBUPROFEN 7.5-200 MG PO TABS: 1.0000 | ORAL_TABLET | Freq: Four times a day (QID) | ORAL | Status: DC | PRN

## 2015-09-29 NOTE — Discharge Instructions (Signed)
Sangrado uterino anormal (Abnormal Uterine Bleeding) El sangrado uterino anormal puede afectar a las mujeres que estn en diversas etapas de la vida, desde adolescentes, mujeres frtiles y Games developer, hasta mujeres que han llegado a la menopausia. Hay diversas clases de sangrado uterino que se consideran anormales, entre ellas:  Prdidas de sangre o [employer] perodos.  Hemorragias luego de Retail banker.  Sangrado abundante o ms que lo habitual.  Perodos que duran ms que lo normal.  Sangrado luego de la menopausia. Muchos casos de sangrado uterino anormal son leves y simples de tratar, mientras que otros son ms graves. El mdico debe evaluar cualquier clase de sangrado anormal. El tratamiento depender de la causa del sangrado. INSTRUCCIONES PARA EL CUIDADO EN EL HOGAR Controle su afeccin para ver si hay cambios. Las siguientes indicaciones ayudarn a Chief Strategy Officer que pueda sentir:  Evite las duchas vaginales y el uso de tampones segn las indicaciones del mdico.  [place] compresas con frecuencia. Deber hacerse exmenes plvicos regulares y pruebas de [place]apan[place]. Cumpla con todas las visitas de control y [employer] diagnsticos, segn le indique su mdico.  SOLICITE ATENCIN MDICA SI:   El sangrado dura ms de 1 semana.  Se siente mareada por momentos. SOLICITE ATENCIN MDICA DE INMEDIATO SI:   Se desmaya.  Debe cambiarse la compresa cada 15 a 30 minutos.  Siente dolor abdominal.  Jaclynn Guarneri.  Se siente dbil o presenta sudoracin.  Elimina cogulos grandes por la vagina.  Comienza a sentir nuseas y [place]. ASEGRESE DE QUE:   Comprende estas instrucciones.  Controlar su afeccin.  Recibir ayuda de inmediato si no mejora o si empeora.   Esta informacin no tiene Marine scientist el consejo del mdico. Asegrese de hacerle al mdico cualquier pregunta que tenga.   Document Released: 08/16/2005  Document Revised: 08/21/2013 Elsevier Interactive Patient Education 2016 [place].  Anemia inespecfica (Anemia, Nonspecific) La anemia es una enfermedad en la que la concentracin de glbulos rojos o el nivel de hemoglobina en la sangre estn por debajo de lo normal. La hemoglobina es la sustancia de los glbulos rojos que lleva el oxgeno a todo el cuerpo. La anemia da como resultado que los tejidos no reciban la cantidad suficiente de oxgeno.  CAUSAS  Las causas ms frecuentes de anemia son:   Elvina Mattes. El sangrado puede ser interno o externo. Incluye sangrado excesivo debido al perodo (en las mujeres) o por los intestinos.   Dficit nutricional.   Enfermedad renal, tiroidea o heptica crnicas.  Enfermedades de la mdula sea que disminuyen la produccin de glbulos rojos.  Cncer y tratamientos para Science writer.  VIH, sida y sus tratamientos.  Trastornos del bazo que aumentan la destruccin de glbulos rojos.  Enfermedades de [employer].  Destruccin excesiva de glbulos rojos debido a una infeccin, a medicamentos y a Nurse, mental health. SIGNOS Y SNTOMAS   Debilidad leve.   Mareos.   Dolor de [place].  Palpitaciones.   Falta de aire, especialmente con el ejercicio.   Palidez.  Sensibilidad al fro.  Indigestin.  Nuseas.  Dificultad para dormir.  Dificultad para concentrarse. Los sntomas pueden ocurrir repentinamente o pueden Psychologist, forensic.  DIAGNSTICO  Con frecuencia es necesario realizar anlisis de [employer]. Estos ayudan al profesional a Adult nurse. Su mdico controlar la materia fecal para Hydrographic surveyor la presencia de [place] y buscar otras causas de prdida de [place].  TRATAMIENTO  El tratamiento vara segn la causa de la anemia. Las opciones  de tratamiento son:   Suplementos de hierro, vitamina [id number], o cido flico.   Medicamentos con hormonas.   Transfusin de [place]. Ser  necesaria en los casos de prdida de [place] grave.   Hospitalizacin. Ser necesaria si la prdida de sangre es continua y significativa.   Cambios en la dieta.  Extirpacin del bazo. INSTRUCCIONES PARA EL CUIDADO EN EL HOGAR Cumpla con todas las visitas de control. Generalmente demora varias semanas corregir la anemia, y es muy importante que el mdico controle su enfermedad y su respuesta al [place]. SOLICITE ATENCIN MDICA DE INMEDIATO SI:   Siente debilidad extrema, falta de aire o dolor en el pecho.   Se siente mareado o tiene dificultad para concentrarse.  Tiene una hemorragia vaginal abundante.   Aparece una erupcin cutnea.   La materia fecal es negra, de aspecto alquitranado.   Se desmaya.   Vomita sangre.   Vomita repetidas veces.   Siente dolor abdominal.  Tiene fiebre o sntomas persistentes durante ms de 2 - 3 das.   Tiene fiebre y los sntomas empeoran repentinamente.   Se deshidrata.  ASEGRESE DE QUE:  Comprende estas instrucciones.  Controlar su afeccin.  Recibir ayuda de inmediato si no mejora o si empeora.   Esta informacin no tiene Marine scientist el consejo del mdico. Asegrese de hacerle al mdico cualquier pregunta que tenga.   Document Released: 08/16/2005 Document Revised: 04/18/2013 Elsevier Interactive Patient Education [employer].

## 2015-09-29 NOTE — MAU Provider Note (Signed)
History   G4P0040 in with c/o heavy bleeding. States periods are reg but last ten days but this month has beenvery heavy with clots. Feeling very weak. States for past three days she has had to change her pads every 74min to one hour and it has been saturated.states pain is 8/10.  CSN: [id number]  Arrival date & time 09/29/15  1427   None     No chief complaint on file.   HPI  Past Medical History  Diagnosis Date  . Medical history non-contributory     Past Surgical History  Procedure Laterality Date  . Breast biopsy    . Laparoscopic unilateral salpingectomy  03/08/13    Right side due to ectopic    No family history on file.  Social History  Substance Use Topics  . Smoking status: Never Smoker   . Smokeless tobacco: Never Used  . Alcohol Use: Yes     Comment: occasional     OB History    Gravida Para Term Preterm AB TAB SAB Ectopic Multiple Living   4    4  2 1   0      Review of Systems  Constitutional: Positive for fatigue.  HENT: Negative.   Eyes: Negative.   Respiratory: Negative.   Cardiovascular: Negative.   Gastrointestinal: Positive for abdominal pain.  Endocrine: Negative.   Genitourinary: Positive for dysuria and vaginal bleeding.  Musculoskeletal: Negative.   Skin: Negative.   Allergic/Immunologic: Negative.   Neurological: Negative.   Hematological: Negative.   Psychiatric/Behavioral: Negative.     Allergies  Aspirin  Home Medications  No current outpatient prescriptions on file.  There were no vitals taken for this visit.  Physical Exam  Constitutional: She is oriented to person, place, and time. She appears well-developed and well-nourished.  HENT:  Head: Normocephalic.  Eyes: Pupils are equal, round, and reactive to light.  Neck: Normal range of motion.  Cardiovascular: Normal rate, regular rhythm, normal heart sounds and intact distal pulses.   Pulmonary/Chest: Effort normal and breath sounds normal.  Abdominal: Soft. Bowel  sounds are normal.  Genitourinary: Vagina normal.  Musculoskeletal: Normal range of motion.  Neurological: She is alert and oriented to person, place, and time. She has normal reflexes.  Skin: Skin is warm and dry.  Psychiatric: She has a normal mood and affect. Her behavior is normal. Judgment and thought content normal.    MAU Course  Procedures (including critical care time)  Labs Reviewed  CBC WITH DIFFERENTIAL/PLATELET   No results found.   DUB   MDM  CBC, U/a, Pelvic u/s reviewed by Dr. Nehemiah Settle and POC discussed. IV ferahem given and will start pt on megace and feso4 and d/c home to f/u in GYN clinic and messagwe sent for them to contact pt for appt.

## 2015-09-29 NOTE — MAU Note (Signed)
Pt states she has been bleeding for the last 10 days, has been very heavy for the last 3 days, is feeling lightheaded.  Also lower abd cramping.  "Like someone is squeezing my uterus."

## 2015-09-30 LAB — GC/CHLAMYDIA PROBE AMP (~~LOC~~) NOT AT ARMC
CHLAMYDIA, DNA PROBE: NEGATIVE
NEISSERIA GONORRHEA: NEGATIVE

## 2015-10-01 ENCOUNTER — Other Ambulatory Visit (HOSPITAL_COMMUNITY)
Admission: RE | Admit: 2015-10-01 | Discharge: 2015-10-01 | Disposition: A | Discharge status: Home / Self Care | Payer: Self-pay | Source: Ambulatory Visit | Attending: Obstetrics & Gynecology | Admitting: Obstetrics & Gynecology

## 2015-10-01 ENCOUNTER — Encounter: Payer: Self-pay | Admitting: Obstetrics & Gynecology

## 2015-10-01 ENCOUNTER — Ambulatory Visit (INDEPENDENT_AMBULATORY_CARE_PROVIDER_SITE_OTHER): Payer: Self-pay | Admitting: Obstetrics & Gynecology

## 2015-10-01 VITALS — BP 106/61 | HR 80 | Temp 98.8°F | Resp 20 | Ht 63.0 in | Wt 227.6 lb

## 2015-10-01 DIAGNOSIS — N939 Abnormal uterine and vaginal bleeding, unspecified: Secondary | ICD-10-CM

## 2015-10-01 DIAGNOSIS — Z3202 Encounter for pregnancy test, result negative: Secondary | ICD-10-CM

## 2015-10-01 DIAGNOSIS — Z01812 Encounter for preprocedural laboratory examination: Secondary | ICD-10-CM

## 2015-10-01 LAB — POCT PREGNANCY, URINE: PREG TEST UR: NEGATIVE

## 2015-10-01 MED ORDER — MEGESTROL ACETATE 40 MG PO TABS: 40.0000 mg | ORAL_TABLET | Freq: Every day | ORAL | Status: DC

## 2015-10-01 NOTE — Progress Notes (Signed)
Patient ID: Maureen Livingston, female   DOB: [birth year], 40 y.o.   MRN: [id number] History:  40 y.o. G4P0040 here today for further eval of AUB.  Pt has prev been dx'd with uterine fibroids.  She reports that her bleeding has improved on Megace which was started in the ED 09/30/2015.  She was referred for further eval and endobx.  Pt desires to [place] her fertility.  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    She denies continuing dizziness.  The following portions of the patient's history were reviewed and updated as appropriate: allergies, current medications, past family history, past medical history, past social history, past surgical history and problem list.  Review of Systems:  Pertinent items are noted in HPI.  Objective:  Physical Exam Blood pressure 106/61, pulse 80, temperature 98.8 F (37.1 C), resp. rate 20, height 5\' 3"  (1.6 m), weight 227 lb 9.6 oz (103.239 kg), last menstrual period 09/18/2015. Gen: NAD Abd: Soft, nontender and nondistended Pelvic: Normal appearing external genitalia; normal appearing vaginal mucosa and cervix.  Normal discharge.  Small uterus, no other palpable masses, no uterine or adnexal tenderness  The indications for endometrial biopsy were reviewed.   Risks of the biopsy including cramping, bleeding, infection, uterine perforation, inadequate specimen and need for additional procedures  were discussed. The patient states she understands and agrees to undergo procedure today. Consent was signed. Time out was performed. Urine HCG was negative. A sterile speculum was placed in the patient's vagina and the cervix was prepped with Betadine. A single-toothed tenaculum was placed on the anterior lip of the cervix to stabilize it. The 3 mm pipelle was introduced into the endometrial cavity without difficulty to a depth of 10cm, and a moderate amount of tissue was obtained and sent to pathology. The instruments were removed from the  patient's vagina. Minimal bleeding from the cervix was noted. The patient tolerated the procedure well.  Labs and Imaging US Transvaginal Non-ob  09/29/2015  CLINICAL DATA:  Dysfunctional uterine bleeding, known uterine fibroids, anemia EXAM: TRANSABDOMINAL AND TRANSVAGINAL ULTRASOUND OF PELVIS TECHNIQUE: Both transabdominal and transvaginal ultrasound examinations of the pelvis were performed. Transabdominal technique was performed for global imaging of the pelvis including uterus, ovaries, adnexal regions, and pelvic cul-de-sac. It was necessary to proceed with endovaginal exam following the transabdominal exam to visualize the endometrium and right ovary. COMPARISON:  01/03/2015 FINDINGS: Uterus Measurements: 9.1 x 4.3 x 5.2 cm. Two dominant fibroids, as follows: --2.2 x 1.9 x 2.6 cm subserosal fibroid in the right uterine body --1.3 x 1.2 x 1.3 cm intramural fibroid in the left uterine body Endometrium Thickness: 15 mm. Thickened with associated 1.3 x 0.8 x 2.9 cm echogenic lesion along the uterine fundus (image 31), with associated vascularity (image 52). Right ovary Measurements: 2.6 x 1.2 x 1.8 cm. Normal appearance/no adnexal mass. Left ovary Measurements: 5.1 x 2.9 x 3.4 cm. 2.9 x 2.6 x 3.0 cm simple cyst. 1.8 x 1.0 x 1.0 cm simple paraovarian cyst. Other findings No abnormal free fluid. IMPRESSION: Focal echogenic endometrial lesion in the uterine fundus, measuring up to 2.9 cm, with associated vascularity. This appearance is suspicious for endometrial polyp. Endometrial sampling is suggested. Two dominant uterine fibroids measuring up to 2.6 cm, previously measuring up to 3.4 cm, possibly mildly decreased. Two simple left ovarian/parovarian cysts measuring up to 3.0 cm, likely physiologic. These results will be called to the ordering clinician or representative by the Radiologist Assistant, and communication  documented in the PACS or zVision Dashboard. Electronically Signed   By: Julian Hy  M.D.   On: 09/29/2015 16:55   US Pelvis Complete  09/29/2015  CLINICAL DATA:  Dysfunctional uterine bleeding, known uterine fibroids, anemia EXAM: TRANSABDOMINAL AND TRANSVAGINAL ULTRASOUND OF PELVIS TECHNIQUE: Both transabdominal and transvaginal ultrasound examinations of the pelvis were performed. Transabdominal technique was performed for global imaging of the pelvis including uterus, ovaries, adnexal regions, and pelvic cul-de-sac. It was necessary to proceed with endovaginal exam following the transabdominal exam to visualize the endometrium and right ovary. COMPARISON:  01/03/2015 FINDINGS: Uterus Measurements: 9.1 x 4.3 x 5.2 cm. Two dominant fibroids, as follows: --2.2 x 1.9 x 2.6 cm subserosal fibroid in the right uterine body --1.3 x 1.2 x 1.3 cm intramural fibroid in the left uterine body Endometrium Thickness: 15 mm. Thickened with associated 1.3 x 0.8 x 2.9 cm echogenic lesion along the uterine fundus (image 31), with associated vascularity (image 52). Right ovary Measurements: 2.6 x 1.2 x 1.8 cm. Normal appearance/no adnexal mass. Left ovary Measurements: 5.1 x 2.9 x 3.4 cm. 2.9 x 2.6 x 3.0 cm simple cyst. 1.8 x 1.0 x 1.0 cm simple paraovarian cyst. Other findings No abnormal free fluid. IMPRESSION: Focal echogenic endometrial lesion in the uterine fundus, measuring up to 2.9 cm, with associated vascularity. This appearance is suspicious for endometrial polyp. Endometrial sampling is suggested. Two dominant uterine fibroids measuring up to 2.6 cm, previously measuring up to 3.4 cm, possibly mildly decreased. Two simple left ovarian/parovarian cysts measuring up to 3.0 cm, likely physiologic. These results will be called to the ordering clinician or representative by the Radiologist Assistant, and communication documented in the PACS or zVision Dashboard. Electronically Signed   By: Julian Hy M.D.   On: 09/29/2015 16:55    Assessment & Plan:  AUB thought due to  Uterine fibroids s/p endo  bx Routine post-procedure instructions were given to the patient. The patient will follow up to review the results and for further management.   Megace 40mg  daily for 3 months Keep FeSO4 F/u in 5 moths F/u results of endobx

## 2015-10-01 NOTE — Progress Notes (Signed)
Interpreter Cresenciano Genre present for encounter.  Pt seen @ MAU on 1/30 for abnormal bleeding and pain. Pt states she has been having abnormal periods for 2 years.

## 2015-10-10 ENCOUNTER — Telehealth: Payer: Self-pay

## 2015-10-10 NOTE — Telephone Encounter (Signed)
Pt endo biopsy was normal patient has been informed of results.

## 2015-10-29 ENCOUNTER — Encounter: Payer: Self-pay | Admitting: *Deleted

## 2015-11-13 ENCOUNTER — Encounter (HOSPITAL_COMMUNITY): Payer: Self-pay | Admitting: *Deleted

## 2015-11-13 ENCOUNTER — Inpatient Hospital Stay (HOSPITAL_COMMUNITY)
Admission: AD | Admit: 2015-11-13 | Discharge: 2015-11-13 | Disposition: A | Discharge status: Home / Self Care | Payer: Self-pay | Source: Ambulatory Visit | Attending: Obstetrics and Gynecology | Admitting: Obstetrics and Gynecology

## 2015-11-13 ENCOUNTER — Inpatient Hospital Stay (HOSPITAL_COMMUNITY): Payer: Self-pay

## 2015-11-13 DIAGNOSIS — Z886 Allergy status to analgesic agent status: Secondary | ICD-10-CM | POA: Insufficient documentation

## 2015-11-13 DIAGNOSIS — N939 Abnormal uterine and vaginal bleeding, unspecified: Secondary | ICD-10-CM

## 2015-11-13 DIAGNOSIS — D259 Leiomyoma of uterus, unspecified: Secondary | ICD-10-CM

## 2015-11-13 DIAGNOSIS — R21 Rash and other nonspecific skin eruption: Secondary | ICD-10-CM | POA: Insufficient documentation

## 2015-11-13 DIAGNOSIS — N938 Other specified abnormal uterine and vaginal bleeding: Secondary | ICD-10-CM

## 2015-11-13 DIAGNOSIS — R109 Unspecified abdominal pain: Secondary | ICD-10-CM

## 2015-11-13 DIAGNOSIS — Z3202 Encounter for pregnancy test, result negative: Secondary | ICD-10-CM | POA: Insufficient documentation

## 2015-11-13 LAB — CBC
HEMATOCRIT: 34.7 % — AB (ref 36.0–46.0)
HEMOGLOBIN: 11.5 g/dL — AB (ref 12.0–15.0)
MCH: 29.8 pg (ref 26.0–34.0)
MCHC: 33.1 g/dL (ref 30.0–36.0)
MCV: 89.9 fL (ref 78.0–100.0)
Platelets: 400 10*3/uL (ref 150–400)
RBC: 3.86 MIL/uL — ABNORMAL LOW (ref 3.87–5.11)
RDW: 17.4 % — ABNORMAL HIGH (ref 11.5–15.5)
WBC: 11.7 10*3/uL — ABNORMAL HIGH (ref 4.0–10.5)

## 2015-11-13 LAB — URINALYSIS, ROUTINE W REFLEX MICROSCOPIC
Bilirubin Urine: NEGATIVE
GLUCOSE, UA: NEGATIVE mg/dL
Ketones, ur: NEGATIVE mg/dL
Leukocytes, UA: NEGATIVE
Nitrite: NEGATIVE
PH: 6 (ref 5.0–8.0)
Protein, ur: NEGATIVE mg/dL
SPECIFIC GRAVITY, URINE: 1.02 (ref 1.005–1.030)

## 2015-11-13 LAB — URINE MICROSCOPIC-ADD ON
Squamous Epithelial / LPF: NONE SEEN
WBC, UA: NONE SEEN WBC/hpf (ref 0–5)

## 2015-11-13 LAB — WET PREP, GENITAL
Clue Cells Wet Prep HPF POC: NONE SEEN
Sperm: NONE SEEN
Trich, Wet Prep: NONE SEEN
Yeast Wet Prep HPF POC: NONE SEEN

## 2015-11-13 LAB — POCT PREGNANCY, URINE: PREG TEST UR: NEGATIVE

## 2015-11-13 MED ORDER — MEGESTROL ACETATE 40 MG PO TABS: 40.0000 mg | ORAL_TABLET | Freq: Two times a day (BID) | ORAL | Status: DC

## 2015-11-13 MED ORDER — MORPHINE SULFATE (PF) 4 MG/ML IV SOLN
2.0000 mg | Freq: Once | INTRAVENOUS | Status: AC
Start: 2015-11-13 — End: 2015-11-13
  Administered 2015-11-13: 2 mg via INTRAMUSCULAR
  Filled 2015-11-13: qty 1

## 2015-11-13 MED ORDER — TRAMADOL HCL 50 MG PO TABS: 50.0000 mg | ORAL_TABLET | Freq: Four times a day (QID) | ORAL | Status: DC | PRN

## 2015-11-13 NOTE — Discharge Instructions (Signed)
Sangrado uterino anormal (Abnormal Uterine Bleeding) El sangrado uterino anormal puede afectar a las mujeres que estn en diversas etapas de la vida, desde adolescentes, mujeres frtiles y Games developer, hasta mujeres que han llegado a la menopausia. Hay diversas clases de sangrado uterino que se consideran anormales, entre ellas:  Prdidas de sangre o [employer] perodos.  Hemorragias luego de Retail banker.  Sangrado abundante o ms que lo habitual.  Perodos que duran ms que lo normal.  Sangrado luego de la menopausia. Muchos casos de sangrado uterino anormal son leves y simples de tratar, mientras que otros son ms graves. El mdico debe evaluar cualquier clase de sangrado anormal. El tratamiento depender de la causa del sangrado. INSTRUCCIONES PARA EL CUIDADO EN EL HOGAR Controle su afeccin para ver si hay cambios. Las siguientes indicaciones ayudarn a Chief Strategy Officer que pueda sentir:  Evite las duchas vaginales y el uso de tampones segn las indicaciones del mdico.  Borden compresas con frecuencia. Deber hacerse exmenes plvicos regulares y pruebas de Papanicolaou. Cumpla con todas las visitas de control y [employer] diagnsticos, segn le indique su mdico.  SOLICITE ATENCIN MDICA SI:   El sangrado dura ms de 1 semana.  Se siente mareada por momentos. SOLICITE ATENCIN MDICA DE INMEDIATO SI:   Se desmaya.  Debe cambiarse la compresa cada 15 a 30 minutos.  Siente dolor abdominal.  Jaclynn Guarneri.  Se siente dbil o presenta sudoracin.  Elimina cogulos grandes por la vagina.  Comienza a sentir nuseas y [place]. ASEGRESE DE QUE:   Comprende estas instrucciones.  Controlar su afeccin.  Recibir ayuda de inmediato si no mejora o si empeora.   Esta informacin no tiene Marine scientist el consejo del mdico. Asegrese de hacerle al mdico cualquier pregunta que tenga.   Document Released: 08/16/2005  Document Revised: 08/21/2013 Elsevier Interactive Patient Education 2016 [employer]. Anemia, Nonspecific Anemia is a condition in which the concentration of red blood cells or hemoglobin in the blood is below normal. Hemoglobin is a substance in red blood cells that carries oxygen to the tissues of the body. Anemia results in not enough oxygen reaching these tissues.  CAUSES  Common causes of anemia include:   Excessive bleeding. Bleeding may be internal or external. This includes excessive bleeding from periods (in women) or from the intestine.   Poor nutrition.   Chronic kidney, thyroid, and liver disease.  Bone marrow disorders that decrease red blood cell production.  Cancer and treatments for cancer.  HIV, AIDS, and their treatments.  Spleen problems that increase red blood cell destruction.  Blood disorders.  Excess destruction of red blood cells due to infection, medicines, and autoimmune disorders. SIGNS AND SYMPTOMS   Minor weakness.   Dizziness.   Headache.  Palpitations.   Shortness of breath, especially with exercise.   Paleness.  Cold sensitivity.  Indigestion.  Nausea.  Difficulty sleeping.  Difficulty concentrating. Symptoms may occur suddenly or they may develop slowly.  DIAGNOSIS  Additional blood tests are often needed. These help your health care provider determine the best treatment. Your health care provider will check your stool for blood and look for other causes of blood loss.  TREATMENT  Treatment varies depending on the cause of the anemia. Treatment can include:   Supplements of iron, vitamin 123456, or folic acid.   Hormone medicines.   A blood transfusion. This may be needed if blood loss is severe.   Hospitalization. This may be needed if there is significant  continual blood loss.   Dietary changes.  Spleen removal. HOME CARE INSTRUCTIONS Keep all follow-up appointments. It often takes many weeks to correct  anemia, and having your health care provider check on your condition and your response to treatment is very important. SEEK IMMEDIATE MEDICAL CARE IF:   You develop extreme weakness, shortness of breath, or chest pain.   You become dizzy or have trouble concentrating.  You develop heavy vaginal bleeding.   You develop a rash.   You have bloody or black, tarry stools.   You faint.   You vomit up blood.   You vomit repeatedly.   You have abdominal pain.  You have a fever or persistent symptoms for more than 2-3 days.   You have a fever and your symptoms suddenly get worse.   You are dehydrated.  MAKE SURE YOU:  Understand these instructions.  Will watch your condition.  Will get help right away if you are not doing well or get worse.   This information is not intended to replace advice given to you by your health care provider. Make sure you discuss any questions you have with your health care provider.   Document Released: 09/23/2004 Document Revised: 04/18/2013 Document Reviewed: 02/09/2013 Elsevier Interactive Patient Education [employer].

## 2015-11-13 NOTE — MAU Provider Note (Signed)
History     CSN: [id number]  Arrival date and time: 11/13/15 1051   First Provider Initiated Contact with Patient 11/13/15 1149      Chief Complaint  Patient presents with  . Abdominal Pain   HPI  [name] is a 40 y.o. female [id number] presenting with severe abdominal pain. The pain started last night and worsened throughout the night.  The patient has a history of abnormal uterine bleeding and uterine fibroids. She is an active patient of the [facility] and was seen last month and was continued on Megace. She was originally started on Megace at the end of January by another ED.  Several days of spotting and now heavy vaginal bleeding. Has been on Megace for abnormal uterine bleeding. Her dose is 40 mg daily.  Last period was January.  She has had this pain before and was told she had a bunch of blood clots in her uterus.   Last normal period was in January.   Took vicoprofen last night and that made her hot and she developed a rash.   Patient is allergic to aspirin; unable to taken ibuprofen, however morphine is fine.    OB History    Gravida Para Term Preterm AB TAB SAB Ectopic Multiple Living   4    4 1 2 1   0      Past Medical History  Diagnosis Date  . Ectopic pregnancy     Past Surgical History  Procedure Laterality Date  . Breast biopsy    . Laparoscopic unilateral salpingectomy  03/08/13    Right side due to ectopic    Family History  Problem Relation Age of Onset  . Leukemia Paternal Grandmother   . Hyperlipidemia Mother     Social History  Substance Use Topics  . Smoking status: Never Smoker   . Smokeless tobacco: Never Used  . Alcohol Use: Yes     Comment: occasional     Allergies:  Allergies  Allergen Reactions  . Aspirin Rash  . Hydrocodone-Ibuprofen Itching and Rash    Prescriptions prior to admission  Medication Sig Dispense Refill Last Dose  . HYDROcodone-ibuprofen (VICOPROFEN) 7.5-200 MG tablet Take 1 tablet by mouth every 6  (six) hours as needed for moderate pain. 30 tablet 0 11/12/2015 at Unknown time  . ferrous sulfate (FERROUSUL) 325 (65 FE) MG tablet Take 1 tablet (325 mg total) by mouth 2 (two) times daily with a meal. (Patient not taking: Reported on 11/13/2015) 60 tablet 3 Taking  . megestrol (MEGACE) 40 MG tablet Take 1 tablet (40 mg total) by mouth daily. (Patient not taking: Reported on 11/13/2015) 30 tablet 3    Results for orders placed or performed during the hospital encounter of 11/13/15 (from the past 48 hour(s))  Wet prep, genital     Status: Abnormal   Collection Time: 11/13/15  2:37 PM  Result Value Ref Range   Yeast Wet Prep HPF POC NONE SEEN NONE SEEN   Trich, Wet Prep NONE SEEN NONE SEEN   Clue Cells Wet Prep HPF POC NONE SEEN NONE SEEN   WBC, Wet Prep HPF POC FEW (A) NONE SEEN    Comment: MODERATE BACTERIA SEEN   Sperm NONE SEEN    Recent Results (from the past 2160 hour(s))  GC/Chlamydia probe amp ([place])not at [facility]     Status: None   Collection Time: 09/29/15 12:00 AM  Result Value Ref Range   Chlamydia Negative     Comment: Normal Reference Range -  Negative   Neisseria gonorrhea Negative     Comment: Normal Reference Range - Negative  CBC with Differential/Platelet     Status: Abnormal   Collection Time: 09/29/15  2:58 PM  Result Value Ref Range   WBC 7.3 4.0 - 10.5 K/uL   RBC 3.34 (L) 3.87 - 5.11 MIL/uL   Hemoglobin 9.6 (L) 12.0 - 15.0 g/dL   HCT 29.9 (L) 36.0 - 46.0 %   MCV 89.5 78.0 - 100.0 fL   MCH 28.7 26.0 - 34.0 pg   MCHC 32.1 30.0 - 36.0 g/dL   RDW 14.9 11.5 - 15.5 %   Platelets 419 (H) 150 - 400 K/uL   Neutrophils Relative % 53 %   Neutro Abs 3.9 1.7 - 7.7 K/uL   Lymphocytes Relative 39 %   Lymphs Abs 2.8 0.7 - 4.0 K/uL   Monocytes Relative 5 %   Monocytes Absolute 0.3 0.1 - 1.0 K/uL   Eosinophils Relative 2 %   Eosinophils Absolute 0.2 0.0 - 0.7 K/uL   Basophils Relative 1 %   Basophils Absolute 0.0 0.0 - 0.1 K/uL  Wet prep, genital     Status: None    Collection Time: 09/29/15  3:03 PM  Result Value Ref Range   Yeast Wet Prep HPF POC NONE SEEN NONE SEEN   Trich, Wet Prep NONE SEEN NONE SEEN   Clue Cells Wet Prep HPF POC NONE SEEN NONE SEEN   WBC, Wet Prep HPF POC NONE SEEN NONE SEEN   Sperm NONE SEEN   Pregnancy, urine POC     Status: None   Collection Time: 09/29/15  3:32 PM  Result Value Ref Range   Preg Test, Ur NEGATIVE NEGATIVE    Comment:        THE SENSITIVITY OF THIS METHODOLOGY IS >24 mIU/mL   Urinalysis, Routine w reflex microscopic (not at [facility])     Status: None   Collection Time: 09/29/15  4:50 PM  Result Value Ref Range   Color, Urine YELLOW YELLOW   APPearance CLEAR CLEAR   Specific Gravity, Urine 1.010 1.005 - 1.030   pH 7.0 5.0 - 8.0   Glucose, UA NEGATIVE NEGATIVE mg/dL   Hgb urine dipstick NEGATIVE NEGATIVE   Bilirubin Urine NEGATIVE NEGATIVE   Ketones, ur NEGATIVE NEGATIVE mg/dL   Protein, ur NEGATIVE NEGATIVE mg/dL   Nitrite NEGATIVE NEGATIVE   Leukocytes, UA NEGATIVE NEGATIVE    Comment: MICROSCOPIC NOT DONE ON URINES WITH NEGATIVE PROTEIN, BLOOD, LEUKOCYTES, NITRITE, OR GLUCOSE <1000 mg/dL.  Pregnancy, urine POC     Status: None   Collection Time: 10/01/15  3:13 PM  Result Value Ref Range   Preg Test, Ur NEGATIVE NEGATIVE    Comment:        THE SENSITIVITY OF THIS METHODOLOGY IS >24 mIU/mL   Urinalysis, Routine w reflex microscopic (not at [facility])     Status: Abnormal   Collection Time: 11/13/15 11:10 AM  Result Value Ref Range   Color, Urine YELLOW YELLOW   APPearance CLEAR CLEAR   Specific Gravity, Urine 1.020 1.005 - 1.030   pH 6.0 5.0 - 8.0   Glucose, UA NEGATIVE NEGATIVE mg/dL   Hgb urine dipstick MODERATE (A) NEGATIVE   Bilirubin Urine NEGATIVE NEGATIVE   Ketones, ur NEGATIVE NEGATIVE mg/dL   Protein, ur NEGATIVE NEGATIVE mg/dL   Nitrite NEGATIVE NEGATIVE   Leukocytes, UA NEGATIVE NEGATIVE  Urine microscopic-add on     Status: Abnormal   Collection Time: 11/13/15 11:10 AM  Result  Value Ref Range   Squamous Epithelial / LPF NONE SEEN NONE SEEN   WBC, UA NONE SEEN 0 - 5 WBC/hpf   RBC / HPF 0-5 0 - 5 RBC/hpf   Bacteria, UA RARE (A) NONE SEEN  Pregnancy, urine POC     Status: None   Collection Time: 11/13/15 11:32 AM  Result Value Ref Range   Preg Test, Ur NEGATIVE NEGATIVE    Comment:        THE SENSITIVITY OF THIS METHODOLOGY IS >24 mIU/mL   CBC     Status: Abnormal   Collection Time: 11/13/15 12:24 PM  Result Value Ref Range   WBC 11.7 (H) 4.0 - 10.5 K/uL   RBC 3.86 (L) 3.87 - 5.11 MIL/uL   Hemoglobin 11.5 (L) 12.0 - 15.0 g/dL   HCT 34.7 (L) 36.0 - 46.0 %   MCV 89.9 78.0 - 100.0 fL   MCH 29.8 26.0 - 34.0 pg   MCHC 33.1 30.0 - 36.0 g/dL   RDW 17.4 (H) 11.5 - 15.5 %   Platelets 400 150 - 400 K/uL  Wet prep, genital     Status: Abnormal   Collection Time: 11/13/15  2:37 PM  Result Value Ref Range   Yeast Wet Prep HPF POC NONE SEEN NONE SEEN   Trich, Wet Prep NONE SEEN NONE SEEN   Clue Cells Wet Prep HPF POC NONE SEEN NONE SEEN   WBC, Wet Prep HPF POC FEW (A) NONE SEEN    Comment: MODERATE BACTERIA SEEN   Sperm NONE SEEN     US Transvaginal Non-ob  11/13/2015  CLINICAL DATA:  Severe abdominal pain EXAM: TRANSABDOMINAL AND TRANSVAGINAL ULTRASOUND OF PELVIS TECHNIQUE: Both transabdominal and transvaginal ultrasound examinations of the pelvis were performed. Transabdominal technique was performed for global imaging of the pelvis including uterus, ovaries, adnexal regions, and pelvic cul-de-sac. It was necessary to proceed with endovaginal exam following the transabdominal exam to visualize the uterus, endometrium and ovaries. COMPARISON:  09/29/2015 FINDINGS: Uterus Measurements: 10.3 x 4.6 x 5.6 cm. Multiple fibroids are noted. The largest is in the posterior myometrium and is partially subserosal measuring 2.3 x 2.2 x 2.4 cm. Within the left lateral myometrium there is a fibroid measuring 1.7 x 1.7 by 1.9 cm. This may have scratch set this appears partially  submucosal. Within the right lateral myometrium there is a fibroid which measures 1.9 x 1.6 x 1.9 cm. Endometrium Thickness: 9.7 mm.  No focal abnormality visualized. Right ovary Measurements: 3 x 1.3 x 1.1 cm. Normal appearance/no adnexal mass. Left ovary Measurements: 3 x 2.1 x 2.1 cm. Normal appearance/no adnexal mass. Other findings No free fluid. IMPRESSION: 1. Fibroid uterus. 2. No acute findings to explain patient's recent onset of abdominal pain. Electronically Signed   By: Kerby Moors M.D.   On: 11/13/2015 13:16     Review of Systems  Constitutional: Negative for fever.  Gastrointestinal: Positive for nausea. Negative for diarrhea and constipation.  Neurological: Negative for dizziness.   Physical Exam   Blood pressure 92/67, pulse 95, temperature 98.5 F (36.9 C), temperature source Oral, resp. rate 20, last menstrual period 11/12/2015.  Physical Exam  Constitutional: She is oriented to person, place, and time. She appears well-developed and well-nourished. No distress.  HENT:  Head: Normocephalic.  Eyes: Pupils are equal, round, and reactive to light.  GI: Soft. There is tenderness in the right lower quadrant, periumbilical area, suprapubic area and left lower quadrant. There is guarding. There is no rigidity  and no rebound.  Genitourinary:  Speculum exam: Vagina - Moderate amount of dark red blood in the vaginal canal. 5 cm piece of tissue pulled from the vaginal canal.  Cervix - cervix open with clot protruding.  Bimanual exam: Cervix closed Uterus non tender, normal size Adnexa non tender, no masses bilaterally GC/Chlam, wet prep done Chaperone present for exam.  Neurological: She is alert and oriented to person, place, and time.  Skin: Skin is warm. She is not diaphoretic.  Psychiatric: Her behavior is normal.    MAU Course  Procedures  None  MDM  Patient thrashing around in the bed going from the sitting, laying position. Patient is moaning and is minimally  responding to questions. Patient rating her pain 10/10 Patient refused exam until pain medication is given Morphine 2mg  IM given Pelvic US complete to evaluate for Torsion  Patient rates her pain 0/10 at the time of discharge. Patient appropriate in conversation.     Assessment and Plan   A:  1. Uterine leiomyoma, unspecified location   2. Sudden onset of severe abdominal pain   3. DUB (dysfunctional uterine bleeding)   4. Abnormal uterine bleeding (AUB)     P:  Discharge home in stable condition RX: Ultram for pain, increase Megace to 40 mg BID Patient is scheduled in the [facility] in 4 months for follow up; patient encouraged to call to be seen sooner if needed.  Bleeding precautions Return to MAU as needed if symptoms worsen    Lezlie Lye, NP 11/13/2015 2:24 PM

## 2015-11-13 NOTE — MAU Note (Signed)
Pt reports she has abd pain that started last night. Cramping and sharp. Last period in January.

## 2016-04-23 ENCOUNTER — Encounter: Payer: Self-pay | Admitting: Obstetrics & Gynecology

## 2016-04-23 ENCOUNTER — Other Ambulatory Visit: Payer: Self-pay | Admitting: Obstetrics & Gynecology

## 2016-04-23 ENCOUNTER — Ambulatory Visit (INDEPENDENT_AMBULATORY_CARE_PROVIDER_SITE_OTHER): Payer: Self-pay | Admitting: Obstetrics & Gynecology

## 2016-04-23 VITALS — BP 106/71 | HR 67 | Ht 63.0 in | Wt 236.0 lb

## 2016-04-23 DIAGNOSIS — N939 Abnormal uterine and vaginal bleeding, unspecified: Secondary | ICD-10-CM

## 2016-04-23 DIAGNOSIS — D259 Leiomyoma of uterus, unspecified: Secondary | ICD-10-CM

## 2016-04-23 LAB — CBC
HCT: 38.7 % (ref 35.0–45.0)
HEMOGLOBIN: 12.2 g/dL (ref 11.7–15.5)
MCH: 27.5 pg (ref 27.0–33.0)
MCHC: 31.5 g/dL — ABNORMAL LOW (ref 32.0–36.0)
MCV: 87.4 fL (ref 80.0–100.0)
MPV: 10.4 fL (ref 7.5–12.5)
Platelets: 372 10*3/uL (ref 140–400)
RBC: 4.43 MIL/uL (ref 3.80–5.10)
RDW: 16.3 % — ABNORMAL HIGH (ref 11.0–15.0)
WBC: 6.1 10*3/uL (ref 3.8–10.8)

## 2016-04-23 LAB — TSH: TSH: 1.96 m[IU]/L

## 2016-04-23 NOTE — Patient Instructions (Addendum)
Fibromas uterinos (Uterine Fibroids) Los fibromas uterinos son masas (tumores) de tejido que pueden desarrollarse en el vientre (tero). Tambin se los conoce como liomiomas. Este tipo de tumor no es canceroso (benigno) y no se disemina a otras partes del cuerpo fuera de la zona plvica, la cual se encuentra entre los huesos de la cadera. En ocasiones, los fibromas pueden crecer en las trompas de [place]op[place], en el cuello del tero o en las estructuras de soporte (ligamentos) que rodean el tero. Una mujer puede tener uno o ms fibromas. Los fibromas pueden tener diferente tamao y peso, y crecer en distintas partes del tero. Algunos pueden crecer hasta volverse bastante grandes. La mayora no requiere tratamiento mdico. CAUSAS Un fibroma puede desarrollarse cuando una nica clula uterina contina creciendo (se multiplica). La mayora de las clulas del cuerpo humano tienen un mecanismo de control que impide que se multipliquen sin control. SIGNOS Y SNTOMAS Entre los sntomas se pueden incluir los siguientes:   Hemorragias intensas durante la menstruacin.  Prdidas de sangre o hemorragias entre los perodos.  Dolor y opresin en la pelvis.  Problemas de la vejiga, como necesidad de orinar con ms frecuencia (polaquiuria) o necesidad imperiosa de orinar.  Incapacidad para reproducir (infertilidad).  Abortos espontneos. DIAGNSTICO Los fibromas uterinos se diagnostican con un examen fsico. El mdico puede palpar los tumores grumosos durante un examen plvico. Pueden realizarse ecografas y una resonancia magntica para determinar el tamao y la ubicacin de los fibromas, as como la cantidad. TRATAMIENTO El tratamiento puede incluir lo siguiente:  Observacin cautelosa. Esto requiere que el mdico controle el fibroma para saber si crece o se achica. Siga las recomendaciones del mdico respecto de la frecuencia con la que debe realizarse los controles.  Medicamentos hormonales. Pueden  tomarse por va oral o administrarse a travs de un dispositivo intrauterino (DIU).  Ciruga.  Extirpacin de los fibromas (miomectoma) o del tero (histerectoma).  Suprimir la irrigacin sangunea a los fibromas (embolizacin de la arteria uterina). Si los fibromas le traen problemas de fertilidad y tiene deseos de quedar embarazada, el mdico puede recomendar su extirpacin.  INSTRUCCIONES PARA EL CUIDADO EN EL HOGAR  Concurra a todas las visitas de control como se lo haya indicado el mdico. Esto es importante.  Tome los medicamentos solamente como se lo haya indicado el mdico.  Si le recetaron un tratamiento hormonal, tome los medicamentos hormonales exactamente como se lo indicaron.  No tome aspirina, ya que puede causar hemorragias.  Consulte al mdico sobre tomar comprimidos de hierro y aumentar la cantidad de verduras de hoja color verde oscuro en la dieta. Estas medidas pueden ayudar a incrementar los niveles de hierro en la sangre, que pueden verse afectados por las hemorragias menstruales intensas.  Preste mucha atencin a la menstruacin e informe al mdico si hay algn cambio, por ejemplo:  Aumento del flujo de sangre que le exige el uso de ms compresas o tampones que los que utiliza normalmente cada mes.  Un cambio en la cantidad de das que le dura la menstruacin cada mes.  Un cambio en los sntomas asociados con la menstruacin, como clicos abdominales o dolor de espalda. SOLICITE ATENCIN MDICA SI:  Tiene dolor plvico, dolor de espalda o clicos abdominales que los medicamentos no pueden controlar.  Observa un aumento del sangrado entre y durante las menstruaciones.  Empapa los tampones o las compresas en el trmino de media hora o menos tiempo.  Se siente mareada, muy cansada o dbil. SOLICITE ATENCIN MDICA DE INMEDIATO   SI:  Se desmaya.  El dolor plvico aumenta repentinamente.   Esta informacin no tiene Marine scientist el consejo del mdico.  Asegrese de hacerle al mdico cualquier pregunta que tenga.   Document Released: 08/16/2005 Document Revised: 09/06/2014 Elsevier Interactive Patient Education 2016 [place] (Dysfunctional Uterine Bleeding) La metrorragia funcional es una hemorragia anormal proveniente del tero. La metrorragia funcional incluye estos sntomas:  Menstruacin que se adelanta o se atrasa.  Menstruacin menos o ms abundante, o con cogulos sanguneos.  Hemorragias entre los perodos [employer].  Ausencia de una o ms menstruaciones.  Hemorragias luego de Retail banker.  Sangrado luego de la menopausia. INSTRUCCIONES PARA EL CUIDADO EN EL HOGAR  Est atenta a cualquier cambio en los sntomas. Estas indicaciones pueden ayudarla con el trastorno: Comidas  Siga una dieta equilibrada. Incluya alimentos con [employer] de hierro, como hgado, carne, Occupational hygienist, verduras de hoja verde y [place].  Si tiene estreimiento:  Beba abundante agua.  Consuma frutas y verduras con alto contenido de agua y [place], [place] espinaca, zanahorias, frambuesas, manzanas y mango. Medicamentos  [employer] de venta libre y los recetados solamente como se lo haya indicado el mdico.  No haga cambios en los medicamentos sin hablar con el mdico.  La aspirina o los medicamentos que la contienen pueden aumentar la hemorragia. No tome esos medicamentos:  Durante la semana previa a Hydrographic surveyor.  Durante la [employer].  Si le recetaron comprimidos de hierro, Social research officer, government se lo haya indicado el mdico. Estos ayudan a Camera operator hierro que el organismo pierde debido a este trastorno. Actividad  Si debe cambiarse el apsito o el tampn ms de una vez cada 2horas:  Acustese con los pies elevados.  Colquese una compresa fra en la parte baja del abdomen.  Haga todo el reposo que pueda hasta que la hemorragia se detenga o disminuya.  No trate de Psychologist, counselling  que la hemorragia se detenga y los niveles de hierro en la sangre se normalicen. Otras indicaciones  [employer], anote lo siguiente:  La fecha de comienzo de Hydrographic surveyor.  La fecha de su finalizacin.  Los [employer] que tiene una hemorragia anormal.  Los problemas que advierte.  Concurra a todas las visitas de control como se lo haya indicado el mdico. Esto es importante. SOLICITE ATENCIN MDICA SI:  Se siente dbil o que va a desvanecerse.  Tiene nuseas y vmitos.  No puede comer ni beber sin vomitar.  Tiene mareos o diarrea mientras toma los medicamentos.  Est tomando anticonceptivos u hormonas, y desea cambiar o suspender estos medicamentos. SOLICITE ATENCIN MDICA DE INMEDIATO SI:  Tiene escalofros o fiebre.  Debe cambiarse el apsito o el tampn ms de una vez por hora.  La hemorragia se vuelve ms abundante o el flujo menstrual contiene cogulos con ms frecuencia.  Siente dolor en el abdomen.  Pierde la conciencia.  Le aparece una erupcin cutnea.   Esta informacin no tiene Marine scientist el consejo del mdico. Asegrese de hacerle al mdico cualquier pregunta que tenga.   Document Released: 05/26/2005 Document Revised: 05/07/2015 Elsevier Interactive Patient Education [employer].

## 2016-04-23 NOTE — Progress Notes (Signed)
Used Technical brewer for [employer].

## 2016-04-23 NOTE — Progress Notes (Signed)
History:  40 y.o. G4P0040 here today for f/u f AUB. Pt reports bleeding for 5 days every month.  She reports that her sx ore improved from prev.  Pt reports that she changes pad hourly for 3 days. Pt denies bleeding between periods.  Pt is on Megace and has been on this bid since March.   The following portions of the patient's history were reviewed and updated as appropriate: allergies, current medications, past family history, past medical history, past social history, past surgical history and problem list.  Review of Systems:  Pertinent items are noted in HPI.  Objective:  Physical Exam Blood pressure 106/71, pulse 67, height 5\' 3"  (1.6 m), weight 236 lb (107 kg). Gen: NAD Lungs: CTA CV: RRR Abd: Soft, nontender and nondistended Pelvic: Normal appearing external genitalia; normal appearing vaginal mucosa and cervix.  Normal discharge.  Small uterus, no other palpable masses, no uterine or adnexal tenderness  Labs and Imaging 11/13/2015 CLINICAL DATA:  Severe abdominal pain  EXAM: TRANSABDOMINAL AND TRANSVAGINAL ULTRASOUND OF PELVIS  TECHNIQUE: Both transabdominal and transvaginal ultrasound examinations of the pelvis were performed. Transabdominal technique was performed for global imaging of the pelvis including uterus, ovaries, adnexal regions, and pelvic cul-de-sac. It was necessary to proceed with endovaginal exam following the transabdominal exam to visualize the uterus, endometrium and ovaries.  COMPARISON:  09/29/2015  FINDINGS: Uterus  Measurements: 10.3 x 4.6 x 5.6 cm. Multiple fibroids are noted. The largest is in the posterior myometrium and is partially subserosal measuring 2.3 x 2.2 x 2.4 cm. Within the left lateral myometrium there is a fibroid measuring 1.7 x 1.7 by 1.9 cm. This may have scratch set this appears partially submucosal. Within the right lateral myometrium there is a fibroid which measures 1.9 x 1.6 x  1.9 cm.  Endometrium  Thickness: 9.7 mm.  No focal abnormality visualized.  Right ovary  Measurements: 3 x 1.3 x 1.1 cm. Normal appearance/no adnexal mass.  Left ovary  Measurements: 3 x 2.1 x 2.1 cm. Normal appearance/no adnexal mass.  Other findings  No free fluid.  IMPRESSION: 1. Fibroid uterus. 2. No acute findings to explain patient's recent onset of abdominal pain.  Assessment & Plan:  AUB- sx improved with Megace. Pt interested in conceiving.  Will try to transition to off OCP's. 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  I have also discuused with her long term managemtnt of AUB if she opts for tx.  Discussed OCPs and LnIUD  CBC and TSH D/c the Megace  Pt requests referral to REI for infertility- Pt was given Dr. Domenic Polite telephone number  F/u in 3 months or sooner prn  Godson Pollan L. Harraway-Smith, M.D., Cherlynn June

## 2016-11-02 ENCOUNTER — Ambulatory Visit: Payer: Self-pay | Admitting: Obstetrics & Gynecology

## 2016-11-18 ENCOUNTER — Ambulatory Visit (INDEPENDENT_AMBULATORY_CARE_PROVIDER_SITE_OTHER): Payer: Self-pay | Admitting: Obstetrics & Gynecology

## 2016-11-18 ENCOUNTER — Encounter: Payer: Self-pay | Admitting: Obstetrics & Gynecology

## 2016-11-18 VITALS — BP 112/64 | HR 74 | Ht 63.0 in | Wt 237.0 lb

## 2016-11-18 DIAGNOSIS — B9689 Other specified bacterial agents as the cause of diseases classified elsewhere: Secondary | ICD-10-CM

## 2016-11-18 DIAGNOSIS — N946 Dysmenorrhea, unspecified: Secondary | ICD-10-CM

## 2016-11-18 DIAGNOSIS — N971 Female infertility of tubal origin: Secondary | ICD-10-CM

## 2016-11-18 DIAGNOSIS — N760 Acute vaginitis: Secondary | ICD-10-CM

## 2016-11-18 DIAGNOSIS — N939 Abnormal uterine and vaginal bleeding, unspecified: Secondary | ICD-10-CM

## 2016-11-18 MED ORDER — ACETAMINOPHEN-CODEINE #3 300-30 MG PO TABS: 1.0000 | ORAL_TABLET | ORAL | 0 refills | Status: DC | PRN

## 2016-11-18 MED ORDER — TRAMADOL HCL 50 MG PO TABS: 50.0000 mg | ORAL_TABLET | Freq: Four times a day (QID) | ORAL | 0 refills | Status: DC | PRN

## 2016-11-18 MED ORDER — METRONIDAZOLE 500 MG PO TABS: 500.0000 mg | ORAL_TABLET | Freq: Two times a day (BID) | ORAL | 0 refills | Status: DC

## 2016-11-18 MED ORDER — MEGESTROL ACETATE 40 MG PO TABS: 40.0000 mg | ORAL_TABLET | Freq: Two times a day (BID) | ORAL | 5 refills | Status: DC

## 2016-11-18 NOTE — Progress Notes (Signed)
History:  41 y.o. G4P0040 here today for f/u of AUB.   Pt reports that 3 months after she stopped the pills her bleeding returned heavy and very painful. Pt s/p HSG and her right fallopian tube is blocked and her Left fallopian tube is also blocked.  She is s/p an ectopic pregnancy on the right side.   Last PAP:  unk. Scheduled for Apr 9th at [facility] for the free PAP screening.  The following portions of the patient's history were reviewed and updated as appropriate: allergies, current medications, past family history, past medical history, past social history, past surgical history and problem list.  Review of Systems:  Pertinent items are noted in HPI.   Objective:  Physical Exam Blood pressure 112/64, pulse 74, height 5\' 3"  (1.6 m), weight 237 lb (107.5 kg), last menstrual period 11/11/2016. BP 112/64   Pulse 74   Ht 5\' 3"  (1.6 m)   Wt 237 lb (107.5 kg)   LMP 11/11/2016 (Exact Date)   BMI 41.98 kg/m  CONSTITUTIONAL: Well-developed, well-nourished female in no acute distress.  HENT:  Normocephalic, atraumatic EYES: Conjunctivae and EOM are normal. No scleral icterus.  NECK: Normal range of motion SKIN: Skin is warm and dry. No rash noted. Not diaphoretic.No pallor. [place]: Alert and oriented to person, place, and time. Normal coordination.  Abd: Soft, nontender and nondistended Pelvic: Normal appearing external genitalia; normal appearing vaginal mucosa and cervix.  Normal discharge.  Small uterus, no other palpable masses, no uterine or adnexal tenderness  Labs and Imaging 10/01/2015 Diagnosis Endometrium, biopsy - EARLY SECRETORY PATTERN ENDOMETRIUM. - NO HYPERPLASIA, ATYPIA, OR MALIGNANCY IDENTIFIED.  Assessment & Plan:  1. Abnormal uterine bleeding (AUB) Megace 40mg  bid   2. 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 Kerin Perna  3. BV (bacterial vaginosis)  - metroNIDAZOLE (FLAGYL) 500 MG tablet; Take 1 tablet (500 mg total) by mouth 2 (two) times  daily.  Dispense: 14 tablet; Refill: 0  Pt will inquire at the PAP screening session about the breast exam and mammogram grants.  A Spanish interpreter was used for the enitre conversation.  Interpretr #[id number]  Tobias Alexander Harraway-Smith, M.D., Cherlynn June

## 2016-12-06 ENCOUNTER — Other Ambulatory Visit: Payer: Self-pay

## 2016-12-15 LAB — CYTOLOGY - PAP
Diagnosis: UNDETERMINED
HPV: DETECTED

## 2016-12-22 ENCOUNTER — Other Ambulatory Visit: Payer: Self-pay | Admitting: Obstetrics and Gynecology

## 2016-12-22 DIAGNOSIS — Z1231 Encounter for screening mammogram for malignant neoplasm of breast: Secondary | ICD-10-CM

## 2016-12-25 ENCOUNTER — Encounter (HOSPITAL_COMMUNITY): Payer: Self-pay | Admitting: Nurse Practitioner

## 2016-12-25 ENCOUNTER — Emergency Department (HOSPITAL_COMMUNITY)
Admission: EM | Admit: 2016-12-25 | Discharge: 2016-12-25 | Disposition: A | Discharge status: Home / Self Care | Payer: Self-pay | Attending: Emergency Medicine | Admitting: Emergency Medicine

## 2016-12-25 DIAGNOSIS — N939 Abnormal uterine and vaginal bleeding, unspecified: Secondary | ICD-10-CM | POA: Insufficient documentation

## 2016-12-25 DIAGNOSIS — Z79899 Other long term (current) drug therapy: Secondary | ICD-10-CM | POA: Insufficient documentation

## 2016-12-25 LAB — BASIC METABOLIC PANEL
Anion gap: 12 (ref 5–15)
BUN: 12 mg/dL (ref 6–20)
CHLORIDE: 106 mmol/L (ref 101–111)
CO2: 20 mmol/L — ABNORMAL LOW (ref 22–32)
CREATININE: 1.08 mg/dL — AB (ref 0.44–1.00)
Calcium: 9 mg/dL (ref 8.9–10.3)
GFR calc Af Amer: >60 mL/min (ref >60)
GFR calc non Af Amer: >60 mL/min (ref >60)
GLUCOSE: 110 mg/dL — AB (ref 65–99)
POTASSIUM: 3.2 mmol/L — AB (ref 3.5–5.1)
SODIUM: 138 mmol/L (ref 135–145)

## 2016-12-25 LAB — WET PREP, GENITAL
Clue Cells Wet Prep HPF POC: NONE SEEN
SPERM: NONE SEEN
TRICH WET PREP: NONE SEEN
YEAST WET PREP: NONE SEEN

## 2016-12-25 LAB — URINALYSIS, ROUTINE W REFLEX MICROSCOPIC
BACTERIA UA: NONE SEEN
BILIRUBIN URINE: NEGATIVE
Glucose, UA: NEGATIVE mg/dL
KETONES UR: 20 mg/dL — AB
NITRITE: NEGATIVE
PROTEIN: 100 mg/dL — AB
SPECIFIC GRAVITY, URINE: 1.024 (ref 1.005–1.030)
SQUAMOUS EPITHELIAL / LPF: NONE SEEN
pH: 5 (ref 5.0–8.0)

## 2016-12-25 LAB — CBC WITH DIFFERENTIAL/PLATELET
Basophils Absolute: 0 10*3/uL (ref 0.0–0.1)
Basophils Relative: 1 %
EOS ABS: 0.2 10*3/uL (ref 0.0–0.7)
EOS PCT: 3 %
HCT: 39.1 % (ref 36.0–46.0)
HEMOGLOBIN: 12.2 g/dL (ref 12.0–15.0)
LYMPHS ABS: 1.9 10*3/uL (ref 0.7–4.0)
Lymphocytes Relative: 24 %
MCH: 24.1 pg — AB (ref 26.0–34.0)
MCHC: 31.2 g/dL (ref 30.0–36.0)
MCV: 77.1 fL — AB (ref 78.0–100.0)
MONOS PCT: 5 %
Monocytes Absolute: 0.4 10*3/uL (ref 0.1–1.0)
NEUTROS ABS: 5.2 10*3/uL (ref 1.7–7.7)
Neutrophils Relative %: 67 %
Platelets: 355 10*3/uL (ref 150–400)
RBC: 5.07 MIL/uL (ref 3.87–5.11)
RDW: 16.9 % — ABNORMAL HIGH (ref 11.5–15.5)
WBC: 7.7 10*3/uL (ref 4.0–10.5)

## 2016-12-25 LAB — RAPID HIV SCREEN (HIV 1/2 AB+AG)
HIV 1/2 Antibodies: NONREACTIVE
HIV-1 P24 Antigen - HIV24: NONREACTIVE

## 2016-12-25 LAB — I-STAT BETA HCG BLOOD, ED (MC, WL, AP ONLY)

## 2016-12-25 MED ORDER — OXYCODONE-ACETAMINOPHEN 5-325 MG PO TABS: 1.0000 | ORAL_TABLET | ORAL | 0 refills | Status: DC | PRN

## 2016-12-25 MED ORDER — ONDANSETRON HCL 4 MG/2ML IJ SOLN
4.0000 mg | Freq: Once | INTRAMUSCULAR | Status: AC
  Administered 2016-12-25: 4 mg via INTRAVENOUS
  Filled 2016-12-25: qty 2

## 2016-12-25 MED ORDER — SODIUM CHLORIDE 0.9 % IV BOLUS (SEPSIS)
1000.0000 mL | Freq: Once | INTRAVENOUS | Status: AC
  Administered 2016-12-25: 1000 mL via INTRAVENOUS

## 2016-12-25 MED ORDER — HYDROMORPHONE HCL 1 MG/ML IJ SOLN
1.0000 mg | Freq: Once | INTRAMUSCULAR | Status: AC
  Administered 2016-12-25: 1 mg via INTRAVENOUS
  Filled 2016-12-25: qty 1

## 2016-12-25 NOTE — ED Provider Notes (Signed)
[facility] DEPT Provider Note   CSN: [id number] Arrival date & time: 12/25/16  0015   By signing my name below, I, Delton Prairie, attest that this documentation has been prepared under the direction and in the presence of Orpah Greek, MD  Electronically Signed: Delton Prairie, ED Scribe. 12/25/16. 12:31 AM.   History   Chief Complaint Chief Complaint  Patient presents with  . Menstrual Problem    HPI Comments:  Maureen Livingston is a 41 y.o. female who presents to the Emergency Department complaining of acute onset, moderate suprapubic abdominal pain x today. She also reports vaginal bleeding due to her menstrual period which began today. Her pain is worse with palpation. She also reports a hx of similar pain secondary to her menstrual period. No alleviating factors noted. Pt denies blood clots or any other associated symptoms. No other complaints noted at this time.   The history is provided by the patient. No language interpreter was used.    Past Medical History:  Diagnosis Date  . Ectopic pregnancy     Patient Active Problem List   Diagnosis Date Noted  . Fibroids 01/17/2015    Past Surgical History:  Procedure Laterality Date  . BREAST BIOPSY    . LAPAROSCOPIC UNILATERAL SALPINGECTOMY  03/08/13   Right side due to ectopic    OB History    Gravida Para Term Preterm AB Living   4       4 0   SAB TAB Ectopic Multiple Live Births   2 1 1            Home Medications    Prior to Admission medications   Medication Sig Start Date End Date Taking? Authorizing Provider  acetaminophen-codeine (TYLENOL #3) 300-30 MG tablet Take 1 tablet by mouth every 4 (four) hours as needed for moderate pain. 11/18/16   Lavonia Drafts, MD  esomeprazole (NEXIUM) 20 MG capsule Take 20 mg by mouth daily at 12 noon.    Historical Provider, MD  megestrol (MEGACE) 40 MG tablet Take 1 tablet (40 mg total) by mouth 2 (two) times daily. Can increase to two tablets twice a  day in the event of heavy bleeding 11/18/16   Lavonia Drafts, MD  metroNIDAZOLE (FLAGYL) 500 MG tablet Take 1 tablet (500 mg total) by mouth 2 (two) times daily. 11/18/16   Lavonia Drafts, MD  oxyCODONE-acetaminophen (PERCOCET) 5-325 MG tablet Take 1 tablet by mouth every 4 (four) hours as needed. 12/25/16   Orpah Greek, MD    Family History Family History  Problem Relation Age of Onset  . Leukemia Paternal Grandmother   . Hyperlipidemia Mother     Social History Social History  Substance Use Topics  . Smoking status: Never Smoker  . Smokeless tobacco: Never Used  . Alcohol use Yes     Comment: occasional      Allergies   Aspirin and Hydrocodone-ibuprofen   Review of Systems Review of Systems  Gastrointestinal: Positive for abdominal pain.  Genitourinary: Positive for vaginal bleeding.  All other systems reviewed and are negative.  Physical Exam Updated Vital Signs BP (!) 101/58 (BP Location: Left Arm)   Pulse 67   Temp 98.2 F (36.8 C) (Oral)   Resp 18   LMP 12/25/2016   SpO2 98%   Physical Exam  Constitutional: She is oriented to person, place, and time. She appears well-developed and well-nourished. No distress.  HENT:  Head: Normocephalic and atraumatic.  Right Ear: Hearing normal.  Left Ear: Hearing  normal.  Nose: Nose normal.  Mouth/Throat: Oropharynx is clear and moist and mucous membranes are normal.  Eyes: Conjunctivae and EOM are normal. Pupils are equal, round, and reactive to light.  Neck: Normal range of motion. Neck supple.  Cardiovascular: Regular rhythm, S1 normal and S2 normal.  Exam reveals no gallop and no friction rub.   No murmur heard. Pulmonary/Chest: Effort normal and breath sounds normal. No respiratory distress. She exhibits no tenderness.  Abdominal: Soft. Normal appearance and bowel sounds are normal. There is no hepatosplenomegaly. There is tenderness in the suprapubic area. There is no rebound, no guarding, no  tenderness at McBurney's point and negative Murphy's sign. No hernia.  Genitourinary: Vagina normal. Cervix exhibits no motion tenderness and no discharge. Right adnexum displays no mass and no tenderness. Left adnexum displays no mass and no tenderness.  Musculoskeletal: Normal range of motion.  Neurological: She is alert and oriented to person, place, and time. She has normal strength. No cranial nerve deficit or sensory deficit. Coordination normal. GCS eye subscore is 4. GCS verbal subscore is 5. GCS motor subscore is 6.  Skin: Skin is warm, dry and intact. No rash noted. No cyanosis.  Psychiatric: She has a normal mood and affect. Her speech is normal and behavior is normal. Thought content normal.  Nursing note and vitals reviewed.  ED Treatments / Results  DIAGNOSTIC STUDIES:  Oxygen Saturation is 96% on RA, normal by my interpretation.    COORDINATION OF CARE:  12:29 AM Discussed treatment plan with pt at bedside and pt agreed to plan.  Labs (all labs ordered are listed, but only abnormal results are displayed) Labs Reviewed  CBC WITH DIFFERENTIAL/PLATELET - Abnormal; Notable for the following:       Result Value   MCV 77.1 (*)    MCH 24.1 (*)    RDW 16.9 (*)    All other components within normal limits  BASIC METABOLIC PANEL - Abnormal; Notable for the following:    Potassium 3.2 (*)    CO2 20 (*)    Glucose, Bld 110 (*)    Creatinine, Ser 1.08 (*)    All other components within normal limits  URINALYSIS, ROUTINE W REFLEX MICROSCOPIC - Abnormal; Notable for the following:    Color, Urine RED (*)    APPearance CLOUDY (*)    Hgb urine dipstick LARGE (*)    Ketones, ur 20 (*)    Protein, ur 100 (*)    Leukocytes, UA SMALL (*)    All other components within normal limits  WET PREP, GENITAL  RAPID HIV SCREEN (HIV 1/2 AB+AG)  HIV ANTIBODY (ROUTINE TESTING)  I-STAT BETA HCG BLOOD, ED ([facility], [facility], [facility] ONLY)  GC/CHLAMYDIA PROBE AMP ([place]) NOT AT [facility]    EKG  EKG  Interpretation None       Radiology No results found.  Procedures Procedures (including critical care time)  Medications Ordered in ED Medications  HYDROmorphone (DILAUDID) injection 1 mg (1 mg Intravenous Given 12/25/16 0107)  ondansetron (ZOFRAN) injection 4 mg (4 mg Intravenous Given 12/25/16 0107)  sodium chloride 0.9 % bolus 1,000 mL (0 mLs Intravenous Stopped 12/25/16 0300)     Initial Impression / Assessment and Plan / ED Course  I have reviewed the triage vital signs and the nursing notes.  Pertinent labs & imaging results that were available during my care of the patient were reviewed by me and considered in my medical decision making (see chart for details).  Patient presents to the emergency department for evaluation of severe menstrual cramps. Patient reports that she has had ongoing issues with this. She started having her period today and has had severe and constant crampy pain. Blood work is normal. She passed a large clot and had resolution of her pain in conjunction with analgesia. Will provide analgesia to use for the weekend, follow-up with her OB/GYN on Monday for repeat evaluation. She is already on Megace, will continue.  Final Clinical Impressions(s) / ED Diagnoses   Final diagnoses:  Abnormal vaginal bleeding    New Prescriptions New Prescriptions   OXYCODONE-ACETAMINOPHEN (PERCOCET) 5-325 MG TABLET    Take 1 tablet by mouth every 4 (four) hours as needed.  I personally performed the services described in this documentation, which was scribed in my presence. The recorded information has been reviewed and is accurate.     Orpah Greek, MD 12/25/16 [phone]

## 2016-12-25 NOTE — ED Notes (Signed)
Bed: WA14 Expected date:  Expected time:  Means of arrival:  Comments: EMS 

## 2016-12-25 NOTE — ED Triage Notes (Signed)
Pt is c/o menstrual cramping, states she had been having this problem more frequently in the recent past, chart review reveals [facility] and diagnosed with abnormal uterine bleeding a month ago. She also reports an episode of vomiting en route here.

## 2016-12-27 LAB — HIV ANTIBODY (ROUTINE TESTING W REFLEX): HIV Screen 4th Generation wRfx: NONREACTIVE

## 2016-12-27 LAB — GC/CHLAMYDIA PROBE AMP (~~LOC~~) NOT AT ARMC
Chlamydia: NEGATIVE
Neisseria Gonorrhea: NEGATIVE

## 2016-12-28 ENCOUNTER — Other Ambulatory Visit (HOSPITAL_COMMUNITY): Payer: Self-pay | Admitting: *Deleted

## 2017-01-06 ENCOUNTER — Ambulatory Visit
Admission: RE | Admit: 2017-01-06 | Discharge: 2017-01-06 | Disposition: A | Discharge status: Home / Self Care | Payer: No Typology Code available for payment source | Source: Ambulatory Visit | Attending: Obstetrics and Gynecology | Admitting: Obstetrics and Gynecology

## 2017-01-06 ENCOUNTER — Encounter (HOSPITAL_COMMUNITY): Payer: Self-pay | Admitting: *Deleted

## 2017-01-06 ENCOUNTER — Encounter (HOSPITAL_COMMUNITY): Payer: Self-pay

## 2017-01-06 ENCOUNTER — Ambulatory Visit (HOSPITAL_COMMUNITY)
Admission: RE | Admit: 2017-01-06 | Discharge: 2017-01-06 | Disposition: A | Discharge status: Home / Self Care | Payer: Self-pay | Source: Ambulatory Visit | Attending: Obstetrics and Gynecology | Admitting: Obstetrics and Gynecology

## 2017-01-06 VITALS — BP 118/80 | Temp 98.4°F | Ht 63.0 in | Wt 234.6 lb

## 2017-01-06 DIAGNOSIS — Z1231 Encounter for screening mammogram for malignant neoplasm of breast: Secondary | ICD-10-CM

## 2017-01-06 DIAGNOSIS — R87810 Cervical high risk human papillomavirus (HPV) DNA test positive: Secondary | ICD-10-CM

## 2017-01-06 DIAGNOSIS — Z1239 Encounter for other screening for malignant neoplasm of breast: Secondary | ICD-10-CM

## 2017-01-06 DIAGNOSIS — R87610 Atypical squamous cells of undetermined significance on cytologic smear of cervix (ASC-US): Secondary | ICD-10-CM

## 2017-01-06 HISTORY — DX: Anemia, unspecified: D64.9

## 2017-01-06 NOTE — Patient Instructions (Signed)
Explained breast self awareness with Cliffton Asters. Patient did not need a Pap smear today due to last Pap smear was 12/06/2016. Explained the colposcopy the recommended follow up for her abnormal Pap smear. Patient referred to the [facility] for [facility] for a colpscopy. Appointment scheduled for Wednesday, Jan 26, 2017 at [place]. Referred patient to the [facility] for a screening mammogram. Appointment scheduled for Thursday, Jan 06, 2017 at 1040. Patient aware of appointments and will be there. Let patient know the [facility] will follow up with her within the next couple weeks with results of mammogram by letter or phone. Cliffton Asters verbalized understanding.  Jayzen Paver, Arvil Chaco, RN 9:23 AM

## 2017-01-06 NOTE — Progress Notes (Signed)
Patient referred to [facility] due to having an abnormal Pap smear 12/06/2016 at the free cervical cancer screening and a colposcopy is recommended for follow up. Patient complained of bilateral breast tenderness when touched. Patient complained that she had a rash under left arm and breast that is now just a scaly area.  Pap Smear: Pap smear not completed today. Last Pap smear was 12/06/2016 at the free cervical cancer screening at the [facility] by [facility] and ASCUS with positive HPV. Patient referred to the [facility] for a colpscopy. Appointment scheduled for Wednesday, Jan 26, 2017 at [place]. Per patient has no history of abnormal Pap smears prior to the most recent Pap smear. Last Pap smear result is in EPIC.  Physical exam: Breasts Breasts symmetrical. No skin abnormalities right breast. A scaly dry area observed left upper outer breast where per patient stated there was a rash there late April. No nipple retraction bilateral breasts. No nipple discharge bilateral breasts. No lymphadenopathy. No lumps palpated bilateral breasts. No complaints of pain or tenderness on exam. Referred patient to the [facility] for a screening mammogram. Appointment scheduled for Thursday, Jan 06, 2017 at 1040.        Pelvic/Bimanual No Pap smear completed today since last Pap smear was 12/06/2016. Pap smear not indicated per [facility] guidelines.   Smoking History: Patient has never smoked.  Patient Navigation: Patient education provided. Access to services provided for patient through [facility] program. Spanish interpreter provided.  Used Spanish interpreter [employer] from [facility].

## 2017-01-07 ENCOUNTER — Other Ambulatory Visit: Payer: Self-pay | Admitting: Obstetrics and Gynecology

## 2017-01-07 DIAGNOSIS — R928 Other abnormal and inconclusive findings on diagnostic imaging of breast: Secondary | ICD-10-CM

## 2017-01-11 ENCOUNTER — Ambulatory Visit
Admission: RE | Admit: 2017-01-11 | Discharge: 2017-01-11 | Disposition: A | Discharge status: Home / Self Care | Payer: No Typology Code available for payment source | Source: Ambulatory Visit | Attending: Obstetrics and Gynecology | Admitting: Obstetrics and Gynecology

## 2017-01-11 ENCOUNTER — Other Ambulatory Visit: Payer: Self-pay | Admitting: Obstetrics and Gynecology

## 2017-01-11 DIAGNOSIS — R928 Other abnormal and inconclusive findings on diagnostic imaging of breast: Secondary | ICD-10-CM

## 2017-01-11 DIAGNOSIS — N6320 Unspecified lump in the left breast, unspecified quadrant: Secondary | ICD-10-CM

## 2017-01-26 ENCOUNTER — Encounter: Payer: Self-pay | Admitting: Obstetrics & Gynecology

## 2017-01-26 ENCOUNTER — Other Ambulatory Visit (HOSPITAL_COMMUNITY)
Admission: RE | Admit: 2017-01-26 | Discharge: 2017-01-26 | Disposition: A | Discharge status: Home / Self Care | Payer: Self-pay | Source: Ambulatory Visit | Attending: Obstetrics & Gynecology | Admitting: Obstetrics & Gynecology

## 2017-01-26 ENCOUNTER — Ambulatory Visit (INDEPENDENT_AMBULATORY_CARE_PROVIDER_SITE_OTHER): Payer: Self-pay | Admitting: Obstetrics & Gynecology

## 2017-01-26 VITALS — BP 97/64 | HR 72 | Ht 63.0 in | Wt 236.0 lb

## 2017-01-26 DIAGNOSIS — R87610 Atypical squamous cells of undetermined significance on cytologic smear of cervix (ASC-US): Secondary | ICD-10-CM

## 2017-01-26 DIAGNOSIS — Z01818 Encounter for other preprocedural examination: Secondary | ICD-10-CM

## 2017-01-26 DIAGNOSIS — N946 Dysmenorrhea, unspecified: Secondary | ICD-10-CM

## 2017-01-26 DIAGNOSIS — Z3202 Encounter for pregnancy test, result negative: Secondary | ICD-10-CM

## 2017-01-26 LAB — POCT PREGNANCY, URINE: Preg Test, Ur: NEGATIVE

## 2017-01-26 MED ORDER — TRAMADOL HCL 50 MG PO TABS: 50.0000 mg | ORAL_TABLET | Freq: Four times a day (QID) | ORAL | 0 refills | Status: AC | PRN

## 2017-01-26 NOTE — Progress Notes (Signed)
Patient given informed consent, signed copy in the chart, time out was performed.  Placed in lithotomy position. Cervix viewed with speculum and colposcope after application of acetic acid.   12/06/2016 ASCUS +hrHPV  Colposcopy adequate?  yes Acetowhite lesions? yes Punctation? no Mosaicism?  no Abnormal vasculature?  no Biopsies? yes ECC? yes   Patient was given post procedure instructions.  She will return in 2 weeks for results.  Pt alos reports dysmenorrhea that is not relieved with Tylenol.  She is allergic to Tylenol. Will prescribe Tramadol prn. Pt called to see Dr. Kerin Perna but they told her she needed a referral.  Referral to Dr. Carmina Miller to eval infertility.       Maureen Livingston L. Harraway-Smith, M.D., Cherlynn June

## 2017-01-26 NOTE — Patient Instructions (Signed)
Colposcopía - Cuidados posteriores  (Colposcopy, Care After)  Siga estas instrucciones durante las próximas semanas. Estas indicaciones le proporcionan información general acerca de cómo deberá cuidarse después del procedimiento. El médico también podrá darle instrucciones más específicas. El tratamiento se ha planificado de acuerdo a las prácticas médicas actuales, pero a veces se producen problemas. Comuníquese con el médico si tiene algún problema o tiene dudas después del procedimiento.  QUÉ ESPERAR DESPUÉS DEL PROCEDIMIENTO   Después del procedimiento, es típico tener las siguientes sensaciones:  · Cólicos. Generalmente se calman en algunos minutos.  · Dolor. Puede durar hasta dos días.  · Aturdimiento. Si esto le ocurre, recuéstese durante algunos minutos.  Podrá tener un sangrado leve o una secreción oscura que debe detenerse en algunos días. Durante este tiempo deberá usar un apósito sanitario.  INSTRUCCIONES PARA EL CUIDADO EN EL HOGAR  · Evite las relaciones sexuales, las duchas vaginales y el uso de tampones durante 3 días, o según lo que le indique su médico.  · Tome sólo medicamentos de venta libre o recetados, según las indicaciones del médico. No tome aspirina, ya que puede causar hemorragias.  · Si utiliza píldoras anticonceptivas, continúe tomándolas.  · No todos los resultados estarán disponibles durante su visita. En este caso, tenga otra entrevista con su médico para conocerlos. No suponga que es normal si no tiene noticias de su médico o del establecimiento de salud. Es importante el seguimiento de todos los resultados de los estudios.  · Siga los consejos de su médico con respecto a los medicamentos, actividades, visitas y [place] de control.  SOLICITE ATENCIÓN MÉDICA SI:  · Aparece una erupción cutánea.  · Tiene problemas con los medicamentos.  SOLICITE ATENCIÓN MÉDICA DE INMEDIATO SI:  · Tiene una hemorragia abundante o elimina coágulos.  · Tiene fiebre.  · Tiene flujo vaginal  anormal.  · Tiene cólicos que no se alivian luego de tomar analgésicos.  · Se siente mareada, tiene vahídos o se desmaya.  · Siente dolor en el estómago.  Esta información no tiene como fin reemplazar el consejo del médico. Asegúrese de hacerle al médico cualquier pregunta que tenga.  Document Released: 06/06/2013  Elsevier Interactive Patient Education © 2017 Elsevier Inc.

## 2017-02-03 ENCOUNTER — Telehealth: Payer: Self-pay

## 2017-02-03 NOTE — Telephone Encounter (Signed)
Per Dr. Maylene RoesTamala Julian, pt needs to be informed of LGSIL results on colpo and to f/u with pap in one year with co-testing.  Notified pt with Raquel Leandro Reasoner, Spanish Interpreter, providers recommendation.   Pt stated understanding with no further questions.

## 2017-03-03 ENCOUNTER — Ambulatory Visit: Payer: Self-pay | Admitting: Obstetrics & Gynecology

## 2017-07-18 ENCOUNTER — Other Ambulatory Visit: Payer: Self-pay

## 2017-07-26 ENCOUNTER — Encounter (HOSPITAL_COMMUNITY): Payer: Self-pay | Admitting: *Deleted

## 2017-07-26 NOTE — Progress Notes (Signed)
Letter mailed to patient to schedule six month left breast ultrasound.

## 2017-08-03 ENCOUNTER — Ambulatory Visit
Admission: RE | Admit: 2017-08-03 | Discharge: 2017-08-03 | Disposition: A | Discharge status: Home / Self Care | Payer: No Typology Code available for payment source | Source: Ambulatory Visit | Attending: Obstetrics and Gynecology | Admitting: Obstetrics and Gynecology

## 2017-08-03 ENCOUNTER — Other Ambulatory Visit: Payer: Self-pay | Admitting: Obstetrics and Gynecology

## 2017-08-03 DIAGNOSIS — N6320 Unspecified lump in the left breast, unspecified quadrant: Secondary | ICD-10-CM

## 2018-02-06 ENCOUNTER — Other Ambulatory Visit: Payer: No Typology Code available for payment source

## 2019-12-03 ENCOUNTER — Encounter (HOSPITAL_COMMUNITY): Payer: Self-pay | Admitting: Emergency Medicine

## 2019-12-03 ENCOUNTER — Other Ambulatory Visit: Payer: Self-pay

## 2019-12-03 ENCOUNTER — Emergency Department (HOSPITAL_COMMUNITY)
Admission: EM | Admit: 2019-12-03 | Discharge: 2019-12-04 | Disposition: A | Discharge status: Home / Self Care | Payer: No Typology Code available for payment source | Attending: Emergency Medicine | Admitting: Emergency Medicine

## 2019-12-03 DIAGNOSIS — D500 Iron deficiency anemia secondary to blood loss (chronic): Secondary | ICD-10-CM

## 2019-12-03 DIAGNOSIS — R102 Pelvic and perineal pain: Secondary | ICD-10-CM | POA: Insufficient documentation

## 2019-12-03 DIAGNOSIS — N921 Excessive and frequent menstruation with irregular cycle: Secondary | ICD-10-CM

## 2019-12-03 LAB — COMPREHENSIVE METABOLIC PANEL WITH GFR
ALT: 20 U/L (ref 0–44)
AST: 19 U/L (ref 15–41)
Albumin: 3.2 g/dL — ABNORMAL LOW (ref 3.5–5.0)
Alkaline Phosphatase: 81 U/L (ref 38–126)
Anion gap: 7 (ref 5–15)
BUN: 14 mg/dL (ref 6–20)
CO2: 21 mmol/L — ABNORMAL LOW (ref 22–32)
Calcium: 8.7 mg/dL — ABNORMAL LOW (ref 8.9–10.3)
Chloride: 106 mmol/L (ref 98–111)
Creatinine, Ser: 0.86 mg/dL (ref 0.44–1.00)
GFR calc Af Amer: >60 mL/min
GFR calc non Af Amer: >60 mL/min
Glucose, Bld: 94 mg/dL (ref 70–99)
Potassium: 4.1 mmol/L (ref 3.5–5.1)
Sodium: 134 mmol/L — ABNORMAL LOW (ref 135–145)
Total Bilirubin: 0.3 mg/dL (ref 0.3–1.2)
Total Protein: 7.1 g/dL (ref 6.5–8.1)

## 2019-12-03 LAB — I-STAT BETA HCG BLOOD, ED (MC, WL, AP ONLY): I-stat hCG, quantitative: <5 m[IU]/mL

## 2019-12-03 LAB — CBC
HCT: 25.9 % — ABNORMAL LOW (ref 36.0–46.0)
Hemoglobin: 7 g/dL — ABNORMAL LOW (ref 12.0–15.0)
MCH: 20 pg — ABNORMAL LOW (ref 26.0–34.0)
MCHC: 27 g/dL — ABNORMAL LOW (ref 30.0–36.0)
MCV: 74 fL — ABNORMAL LOW (ref 80.0–100.0)
Platelets: 515 10*3/uL — ABNORMAL HIGH (ref 150–400)
RBC: 3.5 MIL/uL — ABNORMAL LOW (ref 3.87–5.11)
RDW: 21 % — ABNORMAL HIGH (ref 11.5–15.5)
WBC: 7.3 10*3/uL (ref 4.0–10.5)
nRBC: 0.3 % — ABNORMAL HIGH (ref 0.0–0.2)

## 2019-12-03 LAB — URINALYSIS, ROUTINE W REFLEX MICROSCOPIC
Bilirubin Urine: NEGATIVE
Glucose, UA: NEGATIVE mg/dL
Ketones, ur: NEGATIVE mg/dL
Leukocytes,Ua: NEGATIVE
Nitrite: NEGATIVE
Protein, ur: NEGATIVE mg/dL
RBC / HPF: >50 RBC/hpf — ABNORMAL HIGH (ref 0–5)
Specific Gravity, Urine: 1.021 (ref 1.005–1.030)
pH: 5 (ref 5.0–8.0)

## 2019-12-03 LAB — LIPASE, BLOOD: Lipase: 55 U/L — ABNORMAL HIGH (ref 11–51)

## 2019-12-03 NOTE — ED Triage Notes (Signed)
Pt endorses abd pain and vaginal bleeding for a month. Denies N/V

## 2019-12-04 MED ORDER — MEGESTROL ACETATE 40 MG PO TABS: ORAL_TABLET | ORAL | 3 refills | Status: AC

## 2019-12-04 MED ORDER — KETOROLAC TROMETHAMINE 15 MG/ML IJ SOLN
15.0000 mg | Freq: Once | INTRAMUSCULAR | Status: AC
  Administered 2019-12-04: 15 mg via INTRAVENOUS
  Filled 2019-12-04: qty 1

## 2019-12-04 MED ORDER — FERROUS SULFATE 325 (65 FE) MG PO TABS: 325.0000 mg | ORAL_TABLET | Freq: Every day | ORAL | 0 refills | Status: AC

## 2019-12-04 MED ORDER — NAPROXEN 500 MG PO TABS: 500.0000 mg | ORAL_TABLET | Freq: Two times a day (BID) | ORAL | 0 refills | Status: AC

## 2019-12-04 NOTE — Discharge Instructions (Addendum)
Es muy importante que realice un seguimiento con un obstetra / gineclogo. Tome los medicamentos segn lo prescrito. Toma naproxeno para Conservation officer, historic buildings. Si presenta un empeoramiento de los mareos, dificultad para respirar o cualquier sntoma nuevo o que [place], debe ser [place] de inmediato.

## 2019-12-04 NOTE — ED Notes (Signed)
This spanish speaking RN discussed discharge instructions including OBGYN follow up and prescription with pt. Pt was able to verbalize understanding with no questions at this time. Pt to go home with friend.

## 2019-12-04 NOTE — ED Provider Notes (Signed)
[facility]ER[facility] DEPARTMENT Provider Note   CSN: [id number] Arrival date & time: 12/03/19  1643     History Chief Complaint  Patient presents with  . Abdominal Pain  . Vaginal Bleeding    Maureen Livingston is a 44 y.o. female.  HPI     This is a 44 year old female G4 P0 who presents with vaginal bleeding and dizziness.  Patient reports over the last month she has had ongoing vaginal bleeding.  She states that it is at least biweekly but normally last several weeks at a time.  She states that recently her periods have been "2 to 3 weeks long".  She reports passage of clots.  She states that she wears "a diaper all the time."  She developed dizziness.  She is not followed up with OB/GYN in greater than 2 years.  She previously had some dysfunctional uterine bleeding requiring Megace.  She also had an abnormal colposcopy which she was to follow-up for in 2018 but did not.  Patient reports lower abdominal cramping.  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  Patient denies shortness of breath, chest pain, urinary symptoms.  Interpreter used for history taking.  Past Medical History:  Diagnosis Date  . Anemia   . Ectopic pregnancy     Patient Active Problem List   Diagnosis Date Noted  . Fibroids 01/17/2015    Past Surgical History:  Procedure Laterality Date  . BREAST BIOPSY  2001, 2012   right breast  . LAPAROSCOPIC UNILATERAL SALPINGECTOMY  03/08/13   Right side due to ectopic     OB History    Gravida  4   Para      Term      Preterm      AB  4   Living  0     SAB  2   TAB  1   Ectopic  1   Multiple      Live Births              Family History  Problem Relation Age of Onset  . Leukemia Paternal Grandmother   . Hyperlipidemia Mother     Social History   Tobacco Use  . Smoking status: Never Smoker  . Smokeless tobacco: Never Used  Substance Use Topics  .  Alcohol use: Yes    Comment: occasional   . Drug use: No    Home Medications Prior to Admission medications   Medication Sig Start Date End Date Taking? Authorizing Provider  traMADol (ULTRAM) 50 MG tablet Take 1 tablet (50 mg total) by mouth every 6 (six) hours as needed. 01/26/17   Lavonia Drafts, MD    Allergies    Aspirin and Hydrocodone-ibuprofen  Review of Systems   Review of Systems  Constitutional: Negative for fever.  Respiratory: Negative for shortness of breath.   Cardiovascular: Negative for chest pain.  Gastrointestinal: Positive for abdominal pain. Negative for nausea and vomiting.  Genitourinary: Negative for dysuria.  Neurological: Positive for dizziness and light-headedness. Negative for weakness.  All other systems reviewed and are negative.   Physical Exam Updated Vital Signs BP 105/64   Pulse 66   Temp 98.8 F (37.1 C) (Oral)   Resp 19   Ht 1.6 m (5\' 3" )   Wt 108.9 kg   SpO2 100%   BMI 42.51 kg/m   Physical Exam Vitals and nursing note reviewed.  Constitutional:  Appearance: She is well-developed. She is obese. She is not ill-appearing.  HENT:     Head: Normocephalic and atraumatic.  Eyes:     Pupils: Pupils are equal, round, and reactive to light.  Cardiovascular:     Rate and Rhythm: Normal rate and regular rhythm.     Heart sounds: Normal heart sounds.  Pulmonary:     Effort: Pulmonary effort is normal. No respiratory distress.  Abdominal:     General: Bowel sounds are normal.     Palpations: Abdomen is soft.     Tenderness: There is no abdominal tenderness.  Genitourinary:    Comments: Moderate bleeding noted from the cervical os without clot, no cervical motion tenderness, diffuse fundal tenderness Musculoskeletal:     Cervical back: Neck supple.  Skin:    General: Skin is warm and dry.  Neurological:     Mental Status: She is alert and oriented to person, place, and time.  Psychiatric:        Mood and Affect: Mood  normal.     ED Results / Procedures / Treatments   Labs (all labs ordered are listed, but only abnormal results are displayed) Labs Reviewed  LIPASE, BLOOD - Abnormal; Notable for the following components:      Result Value   Lipase 55 (*)    All other components within normal limits  COMPREHENSIVE METABOLIC PANEL - Abnormal; Notable for the following components:   Sodium 134 (*)    CO2 21 (*)    Calcium 8.7 (*)    Albumin 3.2 (*)    All other components within normal limits  CBC - Abnormal; Notable for the following components:   RBC 3.50 (*)    Hemoglobin 7.0 (*)    HCT 25.9 (*)    MCV 74.0 (*)    MCH 20.0 (*)    MCHC 27.0 (*)    RDW 21.0 (*)    Platelets 515 (*)    nRBC 0.3 (*)    All other components within normal limits  URINALYSIS, ROUTINE W REFLEX MICROSCOPIC - Abnormal; Notable for the following components:   APPearance HAZY (*)    Hgb urine dipstick LARGE (*)    RBC / HPF >50 (*)    Bacteria, UA RARE (*)    All other components within normal limits  I-STAT BETA HCG BLOOD, ED ([facility], [facility], [facility] ONLY)    EKG None  Radiology No results found.  Procedures Procedures (including critical care time)  Medications Ordered in ED Medications  ketorolac (TORADOL) 15 MG/ML injection 15 mg (15 mg Intravenous Given 12/04/19 0121)    ED Course  I have reviewed the triage vital signs and the nursing notes.  Pertinent labs & imaging results that were available during my care of the patient were reviewed by me and considered in my medical decision making (see chart for details).  Clinical Course as of Dec 04 119  Tue Dec 04, 2019  0119 Spoke with Dr. Elonda Husky.  I reviewed the case with him.  He agrees with patient management.  Plan to start iron and Megace to stop the bleeding.  Given her age and low comorbidities and normal vital signs, do not feel she needs transfusion at this time.  I discussed this with the patient.   [CH]    Clinical Course User Index [CH] Lundon Verdejo,  Barbette Hair, MD   MDM Rules/Calculators/A&P  Patient presents with menorrhagia and abdominal pain.  Reports irregular cycles and prolonged bleeding worsening over the last month.  She is overall nontoxic and vital signs are reassuring.  She reports dizziness.  However, she is not tachycardic or hypotensive.  She is not orthostatic.  She has a hemoglobin of 7.0 but no history of heart disease or risk factors.  Given otherwise normal vital signs, would attempt to avoid transfusion.  She does not have clot on pelvic exam but does have some moderate bleeding from the cervical os.  Patient was given Toradol for cramping.  Beta hCG is negative.  Do not believe she is pregnant.  See discussion with OB/GYN above.  Will start on iron and Megace.  She will need close OB/GYN follow-up.  Discussed follow-up and return precautions with the patient.  After history, exam, and medical workup I feel the patient has been appropriately medically screened and is safe for discharge home. Pertinent diagnoses were discussed with the patient. Patient was given return precautions.   Final Clinical Impression(s) / ED Diagnoses Final diagnoses:  Menorrhagia with irregular cycle  Iron deficiency anemia due to chronic blood loss    Rx / DC Orders ED Discharge Orders    None       Dina Rich, Barbette Hair, MD 12/04/19 [phone]

## 2020-01-16 ENCOUNTER — Ambulatory Visit: Payer: No Typology Code available for payment source | Admitting: Obstetrics & Gynecology

## 2021-04-17 ENCOUNTER — Other Ambulatory Visit: Payer: Self-pay | Admitting: Internal Medicine

## 2021-04-17 DIAGNOSIS — N6323 Unspecified lump in the left breast, lower outer quadrant: Secondary | ICD-10-CM

## 2021-05-25 ENCOUNTER — Ambulatory Visit
Admission: RE | Admit: 2021-05-25 | Discharge: 2021-05-25 | Disposition: A | Discharge status: Home / Self Care | Payer: No Typology Code available for payment source | Source: Ambulatory Visit | Attending: Internal Medicine | Admitting: Internal Medicine

## 2021-05-25 ENCOUNTER — Ambulatory Visit
Admission: RE | Admit: 2021-05-25 | Discharge: 2021-05-25 | Disposition: A | Discharge status: Home / Self Care | Payer: 59 | Source: Ambulatory Visit | Attending: Internal Medicine | Admitting: Internal Medicine

## 2021-05-25 ENCOUNTER — Other Ambulatory Visit: Payer: Self-pay | Admitting: Internal Medicine

## 2021-05-25 ENCOUNTER — Other Ambulatory Visit: Payer: Self-pay

## 2021-05-25 DIAGNOSIS — N6323 Unspecified lump in the left breast, lower outer quadrant: Secondary | ICD-10-CM

## 2021-06-16 ENCOUNTER — Other Ambulatory Visit: Payer: Self-pay

## 2021-06-16 ENCOUNTER — Ambulatory Visit
Admission: RE | Admit: 2021-06-16 | Discharge: 2021-06-16 | Disposition: A | Discharge status: Home / Self Care | Payer: 59 | Source: Ambulatory Visit | Attending: Internal Medicine | Admitting: Internal Medicine

## 2021-06-16 DIAGNOSIS — N6323 Unspecified lump in the left breast, lower outer quadrant: Secondary | ICD-10-CM

## 2021-07-03 ENCOUNTER — Other Ambulatory Visit: Payer: Self-pay | Admitting: General Surgery

## 2021-08-08 ENCOUNTER — Other Ambulatory Visit: Payer: Self-pay

## 2021-08-08 ENCOUNTER — Emergency Department (HOSPITAL_COMMUNITY)
Admission: EM | Admit: 2021-08-08 | Discharge: 2021-08-08 | Disposition: A | Discharge status: Home / Self Care | Payer: 59 | Attending: Emergency Medicine | Admitting: Emergency Medicine

## 2021-08-08 ENCOUNTER — Encounter (HOSPITAL_COMMUNITY): Payer: Self-pay | Admitting: Emergency Medicine

## 2021-08-08 DIAGNOSIS — M542 Cervicalgia: Secondary | ICD-10-CM | POA: Diagnosis present

## 2021-08-08 DIAGNOSIS — Y92410 Unspecified street and highway as the place of occurrence of the external cause: Secondary | ICD-10-CM | POA: Diagnosis not present

## 2021-08-08 MED ORDER — METHOCARBAMOL 500 MG PO TABS: 500.0000 mg | ORAL_TABLET | Freq: Three times a day (TID) | ORAL | 0 refills | Status: AC | PRN

## 2021-08-08 MED ORDER — METHOCARBAMOL 500 MG PO TABS
500.0000 mg | ORAL_TABLET | Freq: Once | ORAL | Status: AC
  Administered 2021-08-08: 500 mg via ORAL
  Filled 2021-08-08: qty 1

## 2021-08-08 MED ORDER — ACETAMINOPHEN 500 MG PO TABS
1000.0000 mg | ORAL_TABLET | Freq: Once | ORAL | Status: AC
  Administered 2021-08-08: 1000 mg via ORAL
  Filled 2021-08-08: qty 2

## 2021-08-08 NOTE — ED Notes (Signed)
D/c complete using interpreter Elta Guadeloupe [phone].
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
## 2022-04-20 ENCOUNTER — Other Ambulatory Visit: Payer: Self-pay | Admitting: Internal Medicine

## 2022-04-20 DIAGNOSIS — N6312 Unspecified lump in the right breast, upper inner quadrant: Secondary | ICD-10-CM

## 2022-04-24 IMAGING — MG MM BREAST LOCALIZATION CLIP
4 series · 4 of 12 positions shown · non-contrast
Comparison: Previous exam(s).

CLINICAL DATA: Status post ultrasound-guided biopsy left breast
mass.

EXAM:
3D DIAGNOSTIC LEFT MAMMOGRAM POST ULTRASOUND BIOPSY

[L CC synth-2D]
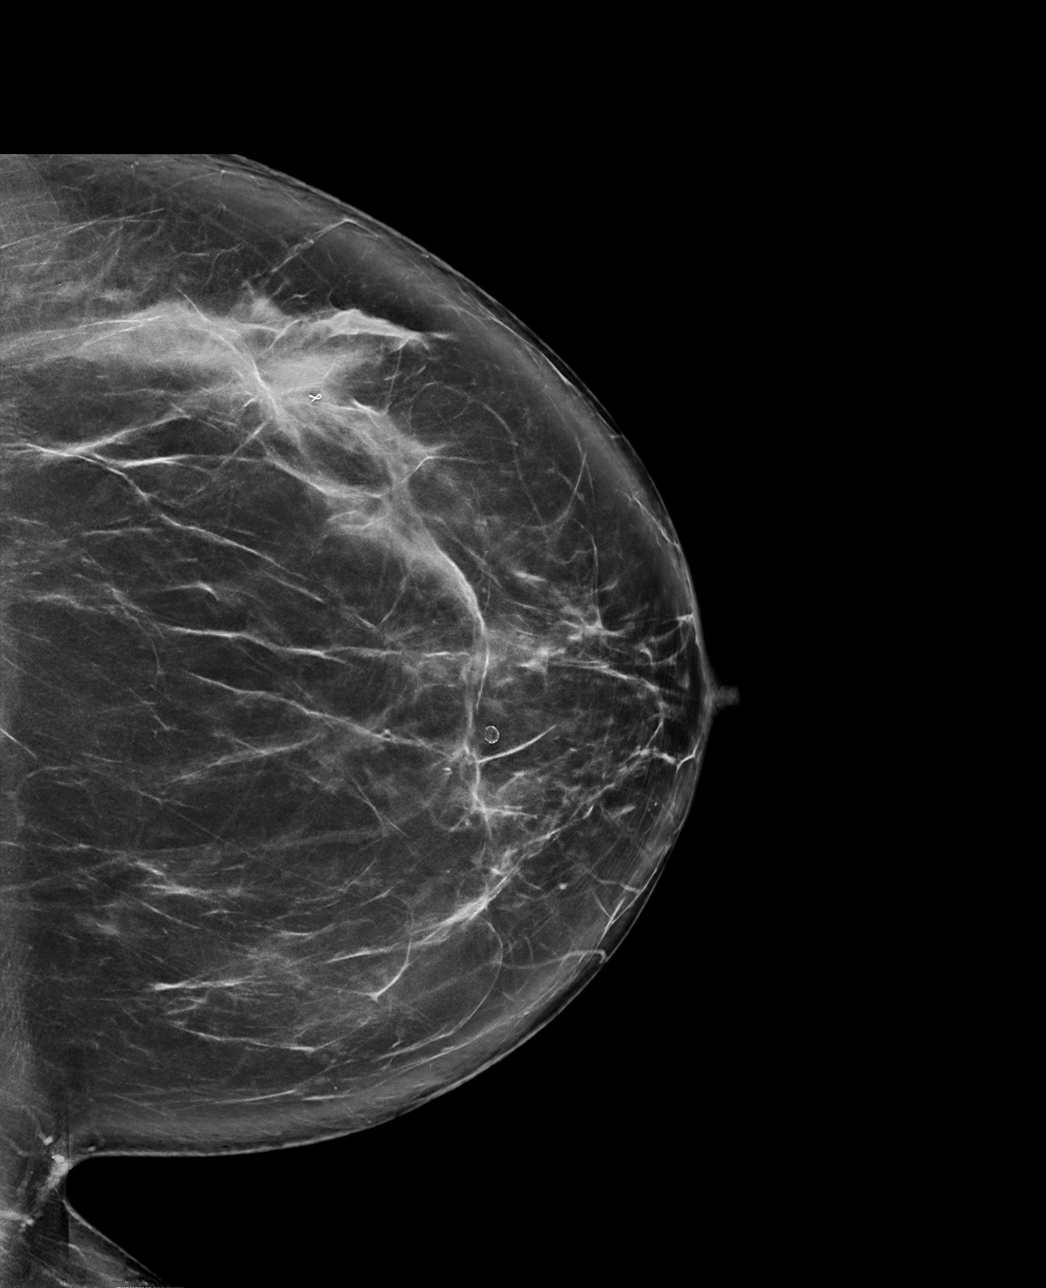

[L ML synth-2D]
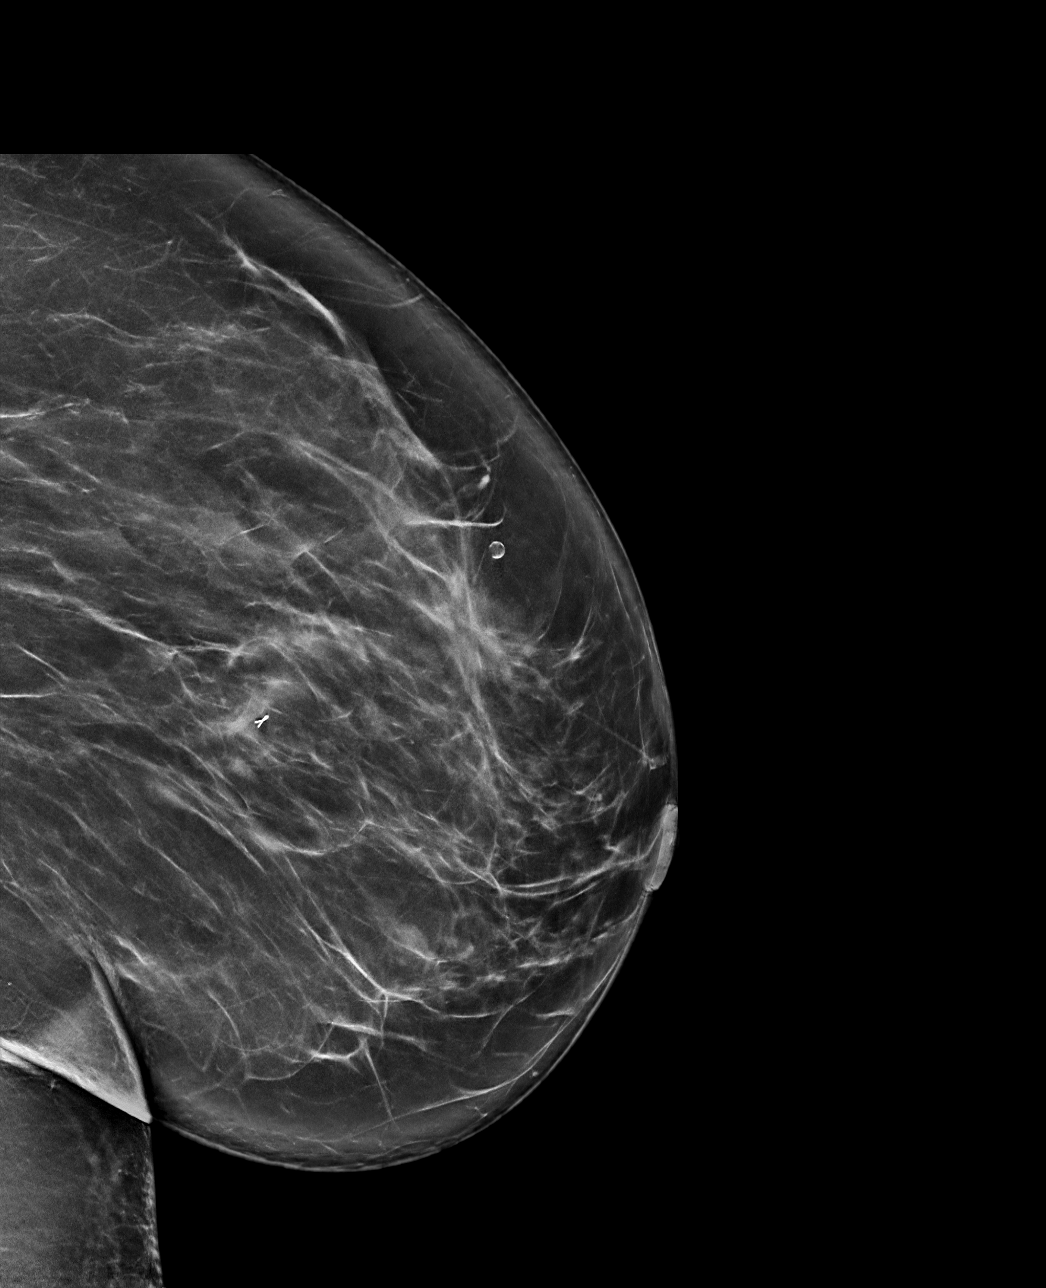

[L ML tomo · tomo slice 48/95.0]
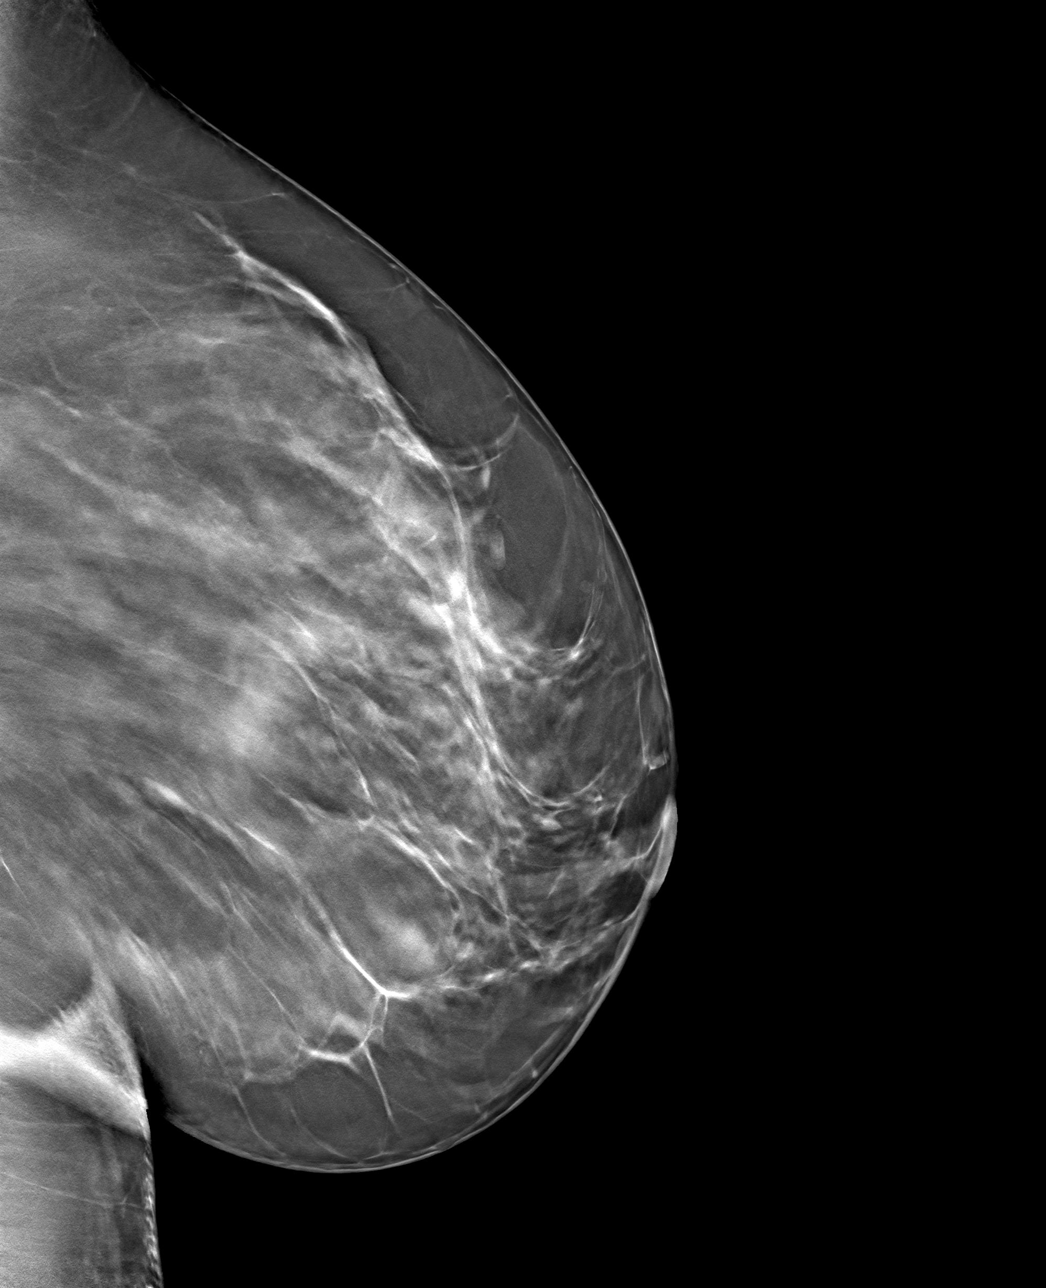

[L CC tomo · tomo slice 48/95.0]
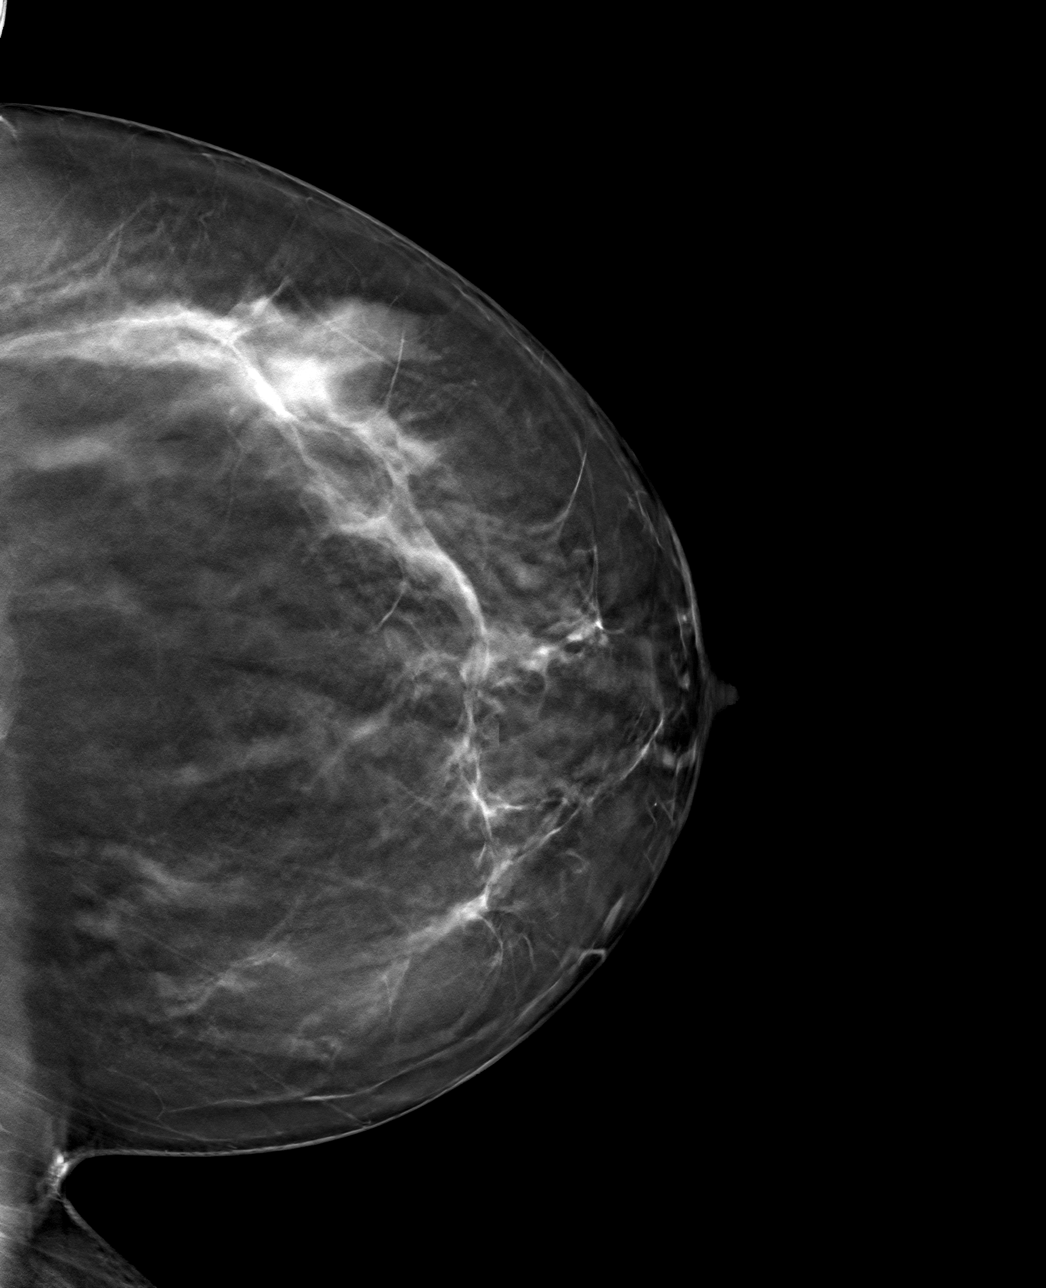

[4 of 12 positions shown; findings below may reference images not displayed]

FINDINGS: 3D Mammographic images were obtained following ultrasound guided
biopsy of left breast mass 3:30 o'clock. The biopsy marking clip is
in expected position at the site of biopsy.
IMPRESSION: Appropriate positioning of the ribbon shaped biopsy marking clip at
the site of biopsy in the left breast.

Final Assessment: Post Procedure Mammograms for Marker Placement

## 2022-05-05 ENCOUNTER — Other Ambulatory Visit: Payer: Self-pay | Admitting: Internal Medicine

## 2022-05-05 ENCOUNTER — Ambulatory Visit
Admission: RE | Admit: 2022-05-05 | Discharge: 2022-05-05 | Disposition: A | Discharge status: Home / Self Care | Payer: Self-pay | Source: Ambulatory Visit | Attending: Internal Medicine | Admitting: Internal Medicine

## 2022-05-05 DIAGNOSIS — N6312 Unspecified lump in the right breast, upper inner quadrant: Secondary | ICD-10-CM

## 2022-10-25 DIAGNOSIS — E669 Obesity, unspecified: Secondary | ICD-10-CM | POA: Diagnosis not present

## 2022-10-25 DIAGNOSIS — E7800 Pure hypercholesterolemia, unspecified: Secondary | ICD-10-CM | POA: Diagnosis not present

## 2022-10-25 DIAGNOSIS — Z6834 Body mass index (BMI) 34.0-34.9, adult: Secondary | ICD-10-CM | POA: Diagnosis not present

## 2022-10-25 DIAGNOSIS — R7303 Prediabetes: Secondary | ICD-10-CM | POA: Diagnosis not present

## 2022-10-25 DIAGNOSIS — K921 Melena: Secondary | ICD-10-CM | POA: Diagnosis not present

## 2022-10-25 DIAGNOSIS — K219 Gastro-esophageal reflux disease without esophagitis: Secondary | ICD-10-CM | POA: Diagnosis not present

## 2022-10-25 DIAGNOSIS — R140 Abdominal distension (gaseous): Secondary | ICD-10-CM | POA: Diagnosis not present

## 2022-10-25 DIAGNOSIS — G8929 Other chronic pain: Secondary | ICD-10-CM | POA: Diagnosis not present

## 2022-10-26 DIAGNOSIS — G8929 Other chronic pain: Secondary | ICD-10-CM | POA: Diagnosis not present

## 2022-10-26 DIAGNOSIS — K219 Gastro-esophageal reflux disease without esophagitis: Secondary | ICD-10-CM | POA: Diagnosis not present

## 2022-10-26 DIAGNOSIS — K921 Melena: Secondary | ICD-10-CM | POA: Diagnosis not present

## 2023-10-12 ENCOUNTER — Ambulatory Visit: Payer: Self-pay | Admitting: Family Medicine
